# Patient Record
Sex: Female | Born: 1965
Health system: Southern US, Community
[De-identification: ages and names within clinical notes are randomized; demographics above are authoritative.]

## PROBLEM LIST (undated history)

## (undated) DIAGNOSIS — Z8619 Personal history of other infectious and parasitic diseases: Secondary | ICD-10-CM

## (undated) DIAGNOSIS — L309 Dermatitis, unspecified: Secondary | ICD-10-CM

## (undated) DIAGNOSIS — E079 Disorder of thyroid, unspecified: Secondary | ICD-10-CM

## (undated) DIAGNOSIS — IMO0002 Reserved for concepts with insufficient information to code with codable children: Secondary | ICD-10-CM

## (undated) DIAGNOSIS — L900 Lichen sclerosus et atrophicus: Secondary | ICD-10-CM

## (undated) DIAGNOSIS — K219 Gastro-esophageal reflux disease without esophagitis: Secondary | ICD-10-CM

## (undated) DIAGNOSIS — IMO0001 Reserved for inherently not codable concepts without codable children: Secondary | ICD-10-CM

## (undated) DIAGNOSIS — J3089 Other allergic rhinitis: Secondary | ICD-10-CM

## (undated) DIAGNOSIS — E7800 Pure hypercholesterolemia, unspecified: Secondary | ICD-10-CM

## (undated) DIAGNOSIS — C801 Malignant (primary) neoplasm, unspecified: Secondary | ICD-10-CM

## (undated) HISTORY — DX: Personal history of other infectious and parasitic diseases: Z86.19

## (undated) HISTORY — PX: PELVIC LAPAROSCOPY: SHX162

## (undated) HISTORY — DX: Disorder of thyroid, unspecified: E07.9

## (undated) HISTORY — DX: Malignant (primary) neoplasm, unspecified: C80.1

## (undated) HISTORY — PX: TONSILLECTOMY AND ADENOIDECTOMY: SUR1326

## (undated) HISTORY — DX: Other allergic rhinitis: J30.89

## (undated) HISTORY — DX: Reserved for inherently not codable concepts without codable children: IMO0001

## (undated) HISTORY — DX: Dermatitis, unspecified: L30.9

## (undated) HISTORY — DX: Gastro-esophageal reflux disease without esophagitis: K21.9

## (undated) HISTORY — DX: Lichen sclerosus et atrophicus: L90.0

## (undated) HISTORY — DX: Pure hypercholesterolemia, unspecified: E78.00

## (undated) HISTORY — PX: REFRACTIVE SURGERY: SHX103

## (undated) HISTORY — DX: Reserved for concepts with insufficient information to code with codable children: IMO0002

---

## 1999-08-16 ENCOUNTER — Inpatient Hospital Stay (HOSPITAL_COMMUNITY): Admission: AD | Admit: 1999-08-16 | Discharge: 1999-08-20 | Payer: Self-pay | Admitting: Gynecology

## 2000-10-09 ENCOUNTER — Other Ambulatory Visit: Admission: RE | Admit: 2000-10-09 | Discharge: 2000-10-09 | Payer: Self-pay | Admitting: Gynecology

## 2001-10-09 ENCOUNTER — Other Ambulatory Visit: Admission: RE | Admit: 2001-10-09 | Discharge: 2001-10-09 | Payer: Self-pay | Admitting: Gynecology

## 2002-02-05 ENCOUNTER — Other Ambulatory Visit: Admission: RE | Admit: 2002-02-05 | Discharge: 2002-02-05 | Payer: Self-pay | Admitting: Dermatology

## 2002-02-23 ENCOUNTER — Ambulatory Visit (HOSPITAL_COMMUNITY): Admission: RE | Admit: 2002-02-23 | Discharge: 2002-02-23 | Payer: Self-pay | Admitting: Obstetrics and Gynecology

## 2002-02-23 ENCOUNTER — Encounter: Payer: Self-pay | Admitting: Obstetrics and Gynecology

## 2003-04-14 ENCOUNTER — Other Ambulatory Visit: Admission: RE | Admit: 2003-04-14 | Discharge: 2003-04-14 | Payer: Self-pay | Admitting: Obstetrics and Gynecology

## 2004-04-14 ENCOUNTER — Other Ambulatory Visit: Admission: RE | Admit: 2004-04-14 | Discharge: 2004-04-14 | Payer: Self-pay | Admitting: Addiction Medicine

## 2005-01-30 ENCOUNTER — Ambulatory Visit (HOSPITAL_COMMUNITY): Admission: RE | Admit: 2005-01-30 | Discharge: 2005-01-30 | Payer: Self-pay | Admitting: Family Medicine

## 2005-04-17 ENCOUNTER — Other Ambulatory Visit: Admission: RE | Admit: 2005-04-17 | Discharge: 2005-04-17 | Payer: Self-pay | Admitting: Obstetrics and Gynecology

## 2006-05-02 ENCOUNTER — Other Ambulatory Visit: Admission: RE | Admit: 2006-05-02 | Discharge: 2006-05-02 | Payer: Self-pay | Admitting: Obstetrics and Gynecology

## 2007-05-14 ENCOUNTER — Other Ambulatory Visit: Admission: RE | Admit: 2007-05-14 | Discharge: 2007-05-14 | Payer: Self-pay | Admitting: Obstetrics and Gynecology

## 2008-05-25 ENCOUNTER — Encounter: Payer: Self-pay | Admitting: Obstetrics and Gynecology

## 2008-05-25 ENCOUNTER — Other Ambulatory Visit: Admission: RE | Admit: 2008-05-25 | Discharge: 2008-05-25 | Payer: Self-pay | Admitting: Obstetrics and Gynecology

## 2008-05-25 ENCOUNTER — Ambulatory Visit: Payer: Self-pay | Admitting: Obstetrics and Gynecology

## 2008-12-10 ENCOUNTER — Encounter: Admission: RE | Admit: 2008-12-10 | Discharge: 2008-12-10 | Payer: Self-pay | Admitting: Obstetrics and Gynecology

## 2009-05-26 ENCOUNTER — Other Ambulatory Visit: Admission: RE | Admit: 2009-05-26 | Discharge: 2009-05-26 | Payer: Self-pay | Admitting: Obstetrics and Gynecology

## 2009-05-26 ENCOUNTER — Ambulatory Visit: Payer: Self-pay | Admitting: Obstetrics and Gynecology

## 2009-06-01 ENCOUNTER — Ambulatory Visit: Payer: Self-pay | Admitting: Obstetrics and Gynecology

## 2009-12-30 ENCOUNTER — Encounter: Admission: RE | Admit: 2009-12-30 | Discharge: 2009-12-30 | Payer: Self-pay | Admitting: Obstetrics and Gynecology

## 2010-05-29 ENCOUNTER — Other Ambulatory Visit (HOSPITAL_COMMUNITY)
Admission: RE | Admit: 2010-05-29 | Discharge: 2010-05-29 | Disposition: A | Discharge status: Home / Self Care | Payer: BC Managed Care – PPO | Source: Ambulatory Visit | Attending: Obstetrics and Gynecology | Admitting: Obstetrics and Gynecology

## 2010-05-29 ENCOUNTER — Other Ambulatory Visit: Payer: Self-pay | Admitting: Obstetrics and Gynecology

## 2010-05-29 ENCOUNTER — Encounter (INDEPENDENT_AMBULATORY_CARE_PROVIDER_SITE_OTHER): Payer: BC Managed Care – PPO | Admitting: Obstetrics and Gynecology

## 2010-05-29 DIAGNOSIS — Z13220 Encounter for screening for lipoid disorders: Secondary | ICD-10-CM

## 2010-05-29 DIAGNOSIS — Z124 Encounter for screening for malignant neoplasm of cervix: Secondary | ICD-10-CM | POA: Insufficient documentation

## 2010-05-29 DIAGNOSIS — D4989 Neoplasm of unspecified behavior of other specified sites: Secondary | ICD-10-CM

## 2010-05-29 DIAGNOSIS — Z833 Family history of diabetes mellitus: Secondary | ICD-10-CM

## 2010-05-29 DIAGNOSIS — E039 Hypothyroidism, unspecified: Secondary | ICD-10-CM

## 2010-05-29 DIAGNOSIS — Z01419 Encounter for gynecological examination (general) (routine) without abnormal findings: Secondary | ICD-10-CM

## 2010-06-16 NOTE — H&P (Signed)
[facility]  Patient:    Carol Estrada, Carol Estrada                        MRN: [id number] Adm. Date:  [id number] Attending:  Merrily Pew                         History and Physical  CHIEF COMPLAINT:              Labor.  HISTORY OF PRESENT ILLNESS:   The patient is a 45 year old G1, P0 female at 79-[redacted] weeks gestation who enters with questionable rupture of membranes, having had a gush of fluid at approximately 3:00 a.m., although undocumented in maternity admissions without gross rupture of membranes, perineal fronting or Nitrocine.  The patient was placed on external monitors and found to be contracting every five minutes, with increasing intensity.  Her exam was 1 cm, 85%, -2 station.  The patient is admitted at this time for labor and delivery.  PAST MEDICAL HISTORY:         Uncomplicated.  PAST SURGICAL HISTORY:        Laparoscopy.  Tonsillectomy.  ALLERGIES:                    CEFTIN.  REVIEW OF SYSTEMS:            Noncontributory.  SOCIAL HISTORY:               No cigarette or alcohol use.  CURRENT MEDICATIONS:          Prenatal vitamins.  ADMISSION PHYSICAL EXAMINATION:  VITAL SIGNS:                  Afebrile with stable vital signs.  HEENT:                        Normal.  LUNGS:                        Clear.  CARDIAC:                      Regular rate without rubs, murmurs or gallops.  ABDOMEN:                      Gravid.  Vertex fetus.  Reactive fetal tracing. Contractions every five minutes.  PELVIC:                       The cervix is loose fingertip, 90%, -2 station. Rupture of forebag.  Clear.  ASSESSMENT:  A 45 year old G1, P0 at 37-38 weeks in early labor admitted for labor and delivery.  Beta Strep is negative.  The patient is Rh negative. DD:  08/16/99 TD:  08/16/99 Job: [id number]

## 2010-06-16 NOTE — Discharge Summary (Signed)
[facility] of [facility]  Patient:    Carol Estrada, Carol Estrada                        MRN: [id number] Adm. Date:  [id number] Disc. Date: [id number] Attending:  Merrily Pew Dictator:   Antony Contras, [facility]                           Discharge Summary  DISCHARGE DIAGNOSES:          1. Intrauterine pregnancy at 37-38 weeks.                               2. Cephalopelvic disproportion.  PROCEDURES:                   Low cervical transverse cesarean section.  HISTORY OF PRESENT ILLNESS:   The patient is a 45 year old, primigravida with an uncertain LMP and an EDC of September 01, 1999, by ultrasound.  Prenatal labs are as follows:  Blood type O-, antibody screen negative, RPR, HBSAG, and HIV, rubella immune, MSAFP normal, GBS negative.  HOSPITAL COURSE:              The patient was admitted on August 15, 1999, in early labor.  The patient progressed slowly from a loose fingertip, 90%, -2 station to a rim dilatation.  At this point, it was noticed that there was increased molding of the fetal head.  Secondary to the history of long labor and also prodromal labor from 8 cm to rim, it was felt prudent to proceed with cesarean section.  A low cervical transverse cesarean section was performed by Douglass Rivers, M.D., under epidural anesthesia.  The patient was delivered of an Apgar 9 and 102 female infant weighing 8 pounds 9 ounces.  Filmy pelvic adhesions were noted, otherwise normal tubes and ovaries.  During the postpartum course, the patient remained afebrile and had no difficulty voiding.  The infant was A+.  She did receive RhoGAM prior to discharge.  On postoperative CBC, the hematocrit was 28.6, hemoglobin 10.3, WBC 11.9, and platelets 179.  DISPOSITION:                  She was able to be discharged in satisfactory condition on her third postpartum day.  FOLLOW-UP:                    Follow up in the office in six weeks.  DISCHARGE MEDICATIONS:        Continue prenatal  vitamins and iron.  Motrin and Tylox for pain. DD:  09/08/99 TD:  09/09/99 Job: [id number]

## 2010-06-16 NOTE — Op Note (Signed)
[facility]  Patient:    Carol Estrada, Carol Estrada                        MRN: [id number] Proc. Date: 08/17/99 Adm. Date:  [id number] Attending:  Merrily Pew CC:         Douglass Rivers, M.D.                           Operative Report  PREOPERATIVE DIAGNOSIS:       Cephalopelvic disproportion.  POSTOPERATIVE DIAGNOSIS:      Cephalopelvic disproportion.  PROCEDURE:                    Primary cesarean section, low flap transverse                               via Pfannenstiel.  SURGEON:                      Douglass Rivers, M.D.  ASSISTANT:                    Scrub technician.  ANESTHESIA:                   Epidural.  ESTIMATED BLOOD LOSS:         800 CC.  FINDINGS:                     This was a viable female infant in the vertex presentation with clear amniotic fluid.  Apgars were 9 and 9.  Birth weight was 8 pounds 9 ounces.  Filmy pelvic adhesions were noted.  Normal tubes and ovaries.   COMPLICATIONS:                None.  PATHOLOGY:                    None.  DESCRIPTION OF PROCEDURE:     The patient was taken to the operating room and placed in the supine position.  The patient was prepped and draped in the usual  sterile fashion.  After adequate anesthesia was ensured, a Pfannenstiel skin incision was made with the scalpel and carried to the underlying layered fascia  with electrocautery.  The fascia was scored in the midline and the incision was  extended laterally with Mayo scissors.  The inferior aspect of the fascial incision was clamped with Kochers.  The underlying rectus muscles were dissected off by blunt and sharp dissection.  In a similar fashion the superior aspect of the fascial incision was grasped with Kochers, and the underlying rectus muscles were dissected off.  The rectus muscles were separated in the midline.  The peritoneum was identified and entered by blunt dissection.  The peritoneal incision was then extended  superiorly and inferiorly with good visualization of the underlying bowel and bladder.  The orientation of the uterus was identified.  The vesicouterine peritoneum was identified, tensed up, and entered sharply with the Metzenbaum. The incision extended laterally.  The bladder flap was created digitally.  The bladder blade was then reinserted in the lower uterine segment and incised in a transverse fashion with the scalpel.  Clear amniotic fluid was noted upon entering the cavity. The infant was delivered from the vertex presentation.  Bulb suction.  Cord was cut and  clamped and the infant handed off to the waiting pediatrician.  The cord bloods were obtained.  The uterus was massaged and the placenta was allowed to separate naturally.  The uterus was then cleared of all clots and debris.  The uterine incision was repaired with a running locking layer of #0 chromic.  There was a small extension on the right, which was also incorporated into the uterine repair. A small portion of the incision was imbricated due to bleeding at the edge. The uterus was returned back to the pelvis after irrigation.  The pelvis was irrigated with copious amounts of normal saline.  There was some bleeding noted at the apex, which was treated with a figure-of-eight.  Again reinspection assures hemostasis. The muscles, bladder, and peritoneum were inspected and noted to be hemostatic.  The fascia was closed with #0 Vicryl starting at the apex and going towards the  midline bilaterally.  The subcutaneous was irrigated.  The skin was closed with  staples.  The patient tolerated the procedure well.  The sponge, lap,  and needle counts ere correct x 2.  She was given Ancef intraoperatively and transferred to the post anesthesia care unit in stable condition. DD:  08/17/99 TD:  08/19/99 Job: [id number]

## 2010-11-29 ENCOUNTER — Other Ambulatory Visit: Payer: Self-pay | Admitting: Obstetrics and Gynecology

## 2010-11-29 DIAGNOSIS — Z1231 Encounter for screening mammogram for malignant neoplasm of breast: Secondary | ICD-10-CM

## 2011-01-05 ENCOUNTER — Ambulatory Visit
Admission: RE | Admit: 2011-01-05 | Discharge: 2011-01-05 | Disposition: A | Discharge status: Home / Self Care | Payer: BC Managed Care – PPO | Source: Ambulatory Visit | Attending: Obstetrics and Gynecology | Admitting: Obstetrics and Gynecology

## 2011-01-05 DIAGNOSIS — Z1231 Encounter for screening mammogram for malignant neoplasm of breast: Secondary | ICD-10-CM

## 2011-05-23 ENCOUNTER — Encounter: Payer: Self-pay | Admitting: Gynecology

## 2011-05-30 ENCOUNTER — Encounter: Payer: Self-pay | Admitting: Obstetrics and Gynecology

## 2011-05-30 ENCOUNTER — Other Ambulatory Visit (HOSPITAL_COMMUNITY)
Admission: RE | Admit: 2011-05-30 | Discharge: 2011-05-30 | Disposition: A | Discharge status: Home / Self Care | Payer: BC Managed Care – PPO | Source: Ambulatory Visit | Attending: Obstetrics and Gynecology | Admitting: Obstetrics and Gynecology

## 2011-05-30 ENCOUNTER — Ambulatory Visit (INDEPENDENT_AMBULATORY_CARE_PROVIDER_SITE_OTHER): Payer: BC Managed Care – PPO | Admitting: Obstetrics and Gynecology

## 2011-05-30 VITALS — BP 130/84 | Ht 64.0 in | Wt 154.0 lb

## 2011-05-30 DIAGNOSIS — Z01419 Encounter for gynecological examination (general) (routine) without abnormal findings: Secondary | ICD-10-CM | POA: Insufficient documentation

## 2011-05-30 DIAGNOSIS — E079 Disorder of thyroid, unspecified: Secondary | ICD-10-CM | POA: Insufficient documentation

## 2011-05-30 DIAGNOSIS — N904 Leukoplakia of vulva: Secondary | ICD-10-CM

## 2011-05-30 DIAGNOSIS — E7800 Pure hypercholesterolemia, unspecified: Secondary | ICD-10-CM

## 2011-05-30 DIAGNOSIS — E039 Hypothyroidism, unspecified: Secondary | ICD-10-CM | POA: Insufficient documentation

## 2011-05-30 LAB — TSH: TSH: 3.482 u[IU]/mL (ref 0.350–4.500)

## 2011-05-30 LAB — CBC WITH DIFFERENTIAL/PLATELET
HCT: 46.1 % — ABNORMAL HIGH (ref 36.0–46.0)
Hemoglobin: 15.6 g/dL — ABNORMAL HIGH (ref 12.0–15.0)
Lymphocytes Relative: 40 % (ref 12–46)
Lymphs Abs: 3.1 10*3/uL (ref 0.7–4.0)
Monocytes Absolute: 0.6 10*3/uL (ref 0.1–1.0)
Monocytes Relative: 7 % (ref 3–12)
Neutro Abs: 4 10*3/uL (ref 1.7–7.7)
Neutrophils Relative %: 52 % (ref 43–77)
RBC: 4.84 MIL/uL (ref 3.87–5.11)

## 2011-05-30 LAB — LIPID PANEL: LDL Cholesterol: 190 mg/dL — ABNORMAL HIGH (ref 0–99)

## 2011-05-30 MED ORDER — LEVOTHYROXINE SODIUM 25 MCG PO TABS: 25.0000 ug | ORAL_TABLET | Freq: Every day | ORAL | Status: DC

## 2011-05-30 MED ORDER — NORETHINDRONE-ETH ESTRADIOL 1-35 MG-MCG PO TABS: 1.0000 | ORAL_TABLET | Freq: Every day | ORAL | Status: DC

## 2011-05-30 MED ORDER — TRIAMTERENE-HCTZ 37.5-25 MG PO CAPS: 1.0000 | ORAL_CAPSULE | Freq: Every day | ORAL | Status: DC

## 2011-05-30 NOTE — Progress Notes (Signed)
Patient came to see me today for her annual GYN exam. She is having regular cycles on birth control pills. She takes them both for cycle control and contraception. She is having no pelvic pain. Several years ago she was complaining of hot flashes. Her TSH was slightly elevated. We have supported her on 25 MCG of  Synthroid. Since she's been on it she does not have hot flashes. She uses a fluid pill for fluid retention with excellent results. She is up-to-date on mammograms. She is having no abnormal bleeding. She had an area of lichen sclerosis near her anus. It has responded and disappeared with Temovate. She does not have dyspareunia.  Physical examination: Kennon Portela present. HEENT within normal limits. Neck: Thyroid not large. No masses. Supraclavicular nodes: not enlarged. Breasts: Examined in both sitting and lying  position. No skin changes and no masses. Abdomen: Soft no guarding rebound or masses or hernia. Pelvic: External: Within normal limits. BUS: Within normal limits. Vaginal:within normal limits. Good estrogen effect. No evidence of cystocele rectocele or enterocele. Cervix: clean. Uterus: Normal size and shape. Adnexa: No masses. Rectovaginal exam: Confirmatory and negative. Extremities: Within normal limits.  Assessment: #1. Hypothyroidism #2. Endometriosis #3. Lichen sclerosis  Plan: Continue Ortho-Novum 1/35. Continue Dyazide. Continue Synthroid. TSH checked.

## 2011-05-31 LAB — URINALYSIS W MICROSCOPIC + REFLEX CULTURE
Bacteria, UA: NONE SEEN
Bilirubin Urine: NEGATIVE
Casts: NONE SEEN
Crystals: NONE SEEN
Glucose, UA: NEGATIVE mg/dL
Ketones, ur: NEGATIVE mg/dL
Specific Gravity, Urine: <1.005 — ABNORMAL LOW (ref 1.005–1.030)
Squamous Epithelial / LPF: NONE SEEN
Urobilinogen, UA: 0.2 mg/dL (ref 0.0–1.0)
pH: 6.5 (ref 5.0–8.0)

## 2012-01-08 ENCOUNTER — Other Ambulatory Visit: Payer: Self-pay | Admitting: Gynecology

## 2012-01-08 ENCOUNTER — Other Ambulatory Visit: Payer: Self-pay | Admitting: Obstetrics and Gynecology

## 2012-01-08 DIAGNOSIS — Z1231 Encounter for screening mammogram for malignant neoplasm of breast: Secondary | ICD-10-CM

## 2012-01-17 ENCOUNTER — Ambulatory Visit (INDEPENDENT_AMBULATORY_CARE_PROVIDER_SITE_OTHER): Payer: BC Managed Care – PPO | Admitting: Gynecology

## 2012-01-17 ENCOUNTER — Encounter: Payer: Self-pay | Admitting: Gynecology

## 2012-01-17 VITALS — Temp 97.4°F

## 2012-01-17 DIAGNOSIS — M545 Low back pain, unspecified: Secondary | ICD-10-CM

## 2012-01-17 DIAGNOSIS — R6883 Chills (without fever): Secondary | ICD-10-CM

## 2012-01-17 LAB — URINALYSIS W MICROSCOPIC + REFLEX CULTURE
Glucose, UA: NEGATIVE mg/dL
Leukocytes, UA: NEGATIVE
Protein, ur: NEGATIVE mg/dL
Specific Gravity, Urine: <1.005 — ABNORMAL LOW (ref 1.005–1.030)
pH: 6.5 (ref 5.0–8.0)

## 2012-01-17 NOTE — Patient Instructions (Signed)
Take ibuprofen 600-800 mg at bedtime to help with her back pain. Monitor your other symptoms. If urinary symptoms develop or any other symptoms present then call. Otherwise we will follow at present.

## 2012-01-17 NOTE — Progress Notes (Signed)
Patient presents with several days noting some low back pain particularly at night. Having some chilly feelings, some mild aching and loss of appetite. No frank fevers, nausea, vomiting, diarrhea or constipation. No cough sputum URI symptoms.  Exam was kim assistant temperature 97.4 HEENT normal. Spine straight without paraspinous muscle spasm or tenderness Lungs clear Cardiac regular rate no rubs murmurs or gallops Abdomen soft nontender without masses guarding rebound organomegaly. Pelvic external normal bimanual without masses or tenderness  Urinalysis normal  Assessment and plan: Low back pain I suspect is musculoskeletal. Recommend ibuprofen 600-800 mg nightly. Chills, loss of appetite. Suspect low-grade viral. Will push fluids and monitor. Follow up if any new symptoms develop or the symptoms persist. Otherwise assuming they resolve then she'll follow up routinely.

## 2012-01-22 ENCOUNTER — Ambulatory Visit
Admission: RE | Admit: 2012-01-22 | Discharge: 2012-01-22 | Disposition: A | Discharge status: Home / Self Care | Payer: BC Managed Care – PPO | Source: Ambulatory Visit | Attending: Gynecology | Admitting: Gynecology

## 2012-01-22 DIAGNOSIS — Z1231 Encounter for screening mammogram for malignant neoplasm of breast: Secondary | ICD-10-CM

## 2012-04-25 ENCOUNTER — Other Ambulatory Visit: Payer: Self-pay | Admitting: *Deleted

## 2012-04-25 MED ORDER — PRAVASTATIN SODIUM 10 MG PO TABS: 10.0000 mg | ORAL_TABLET | Freq: Every day | ORAL | Status: DC

## 2012-05-13 ENCOUNTER — Other Ambulatory Visit: Payer: Self-pay | Admitting: Family Medicine

## 2012-05-13 NOTE — Telephone Encounter (Signed)
Chronic visit, 09/19/11

## 2012-06-05 ENCOUNTER — Ambulatory Visit (INDEPENDENT_AMBULATORY_CARE_PROVIDER_SITE_OTHER): Payer: BC Managed Care – PPO | Admitting: Gynecology

## 2012-06-05 ENCOUNTER — Encounter: Payer: Self-pay | Admitting: Obstetrics and Gynecology

## 2012-06-05 ENCOUNTER — Encounter: Payer: Self-pay | Admitting: Gynecology

## 2012-06-05 VITALS — BP 120/76 | Ht 63.0 in | Wt 158.0 lb

## 2012-06-05 DIAGNOSIS — B001 Herpesviral vesicular dermatitis: Secondary | ICD-10-CM

## 2012-06-05 DIAGNOSIS — B009 Herpesviral infection, unspecified: Secondary | ICD-10-CM

## 2012-06-05 DIAGNOSIS — E039 Hypothyroidism, unspecified: Secondary | ICD-10-CM

## 2012-06-05 DIAGNOSIS — Z01419 Encounter for gynecological examination (general) (routine) without abnormal findings: Secondary | ICD-10-CM

## 2012-06-05 DIAGNOSIS — L900 Lichen sclerosus et atrophicus: Secondary | ICD-10-CM

## 2012-06-05 DIAGNOSIS — Z309 Encounter for contraceptive management, unspecified: Secondary | ICD-10-CM

## 2012-06-05 DIAGNOSIS — L940 Localized scleroderma [morphea]: Secondary | ICD-10-CM

## 2012-06-05 LAB — CBC WITH DIFFERENTIAL/PLATELET
Eosinophils Absolute: 0 10*3/uL (ref 0.0–0.7)
Eosinophils Relative: 1 % (ref 0–5)
HCT: 43.3 % (ref 36.0–46.0)
Hemoglobin: 15.4 g/dL — ABNORMAL HIGH (ref 12.0–15.0)
Lymphs Abs: 2.5 10*3/uL (ref 0.7–4.0)
MCH: 32.8 pg (ref 26.0–34.0)
MCV: 92.3 fL (ref 78.0–100.0)
Monocytes Absolute: 0.8 10*3/uL (ref 0.1–1.0)
Monocytes Relative: 12 % (ref 3–12)
RBC: 4.69 MIL/uL (ref 3.87–5.11)

## 2012-06-05 LAB — URINALYSIS W MICROSCOPIC + REFLEX CULTURE
Glucose, UA: NEGATIVE mg/dL
Leukocytes, UA: NEGATIVE
Protein, ur: NEGATIVE mg/dL
Squamous Epithelial / LPF: NONE SEEN
Urobilinogen, UA: 0.2 mg/dL (ref 0.0–1.0)

## 2012-06-05 LAB — COMPREHENSIVE METABOLIC PANEL
CO2: 22 mEq/L (ref 19–32)
Glucose, Bld: 108 mg/dL — ABNORMAL HIGH (ref 70–99)
Sodium: 135 mEq/L (ref 135–145)
Total Bilirubin: 0.3 mg/dL (ref 0.3–1.2)
Total Protein: 6.6 g/dL (ref 6.0–8.3)

## 2012-06-05 MED ORDER — TRIAMTERENE-HCTZ 37.5-25 MG PO CAPS: 1.0000 | ORAL_CAPSULE | ORAL | Status: DC

## 2012-06-05 MED ORDER — NORETHINDRONE-ETH ESTRADIOL 1-35 MG-MCG PO TABS: 1.0000 | ORAL_TABLET | Freq: Every day | ORAL | Status: DC

## 2012-06-05 MED ORDER — LEVOTHYROXINE SODIUM 25 MCG PO TABS: 25.0000 ug | ORAL_TABLET | Freq: Every day | ORAL | Status: DC

## 2012-06-05 NOTE — Progress Notes (Signed)
Carol Estrada Jun 28, 1965 [id number]        47 y.o.  G1P1001 for annual exam.  Doing well. Former patient of Dr. Eda Paschal. Several issues noted below.  Past medical history,surgical history, medications, allergies, family history and social history were all reviewed and documented in the EPIC chart. ROS:  Was performed and pertinent positives and negatives are included in the history.  Exam: Kim assistant Filed Vitals:   06/05/12 0941  BP: 120/76  Height: 5\' 3"  (1.6 m)  Weight: 158 lb (71.668 kg)   General appearance  Normal Skin grossly normal Head/Neck normal with no cervical or supraclavicular adenopathy thyroid normal Lungs  clear Cardiac RR, without RMG Abdominal  soft, nontender, without masses, organomegaly or hernia Breasts  examined lying and sitting without masses, retractions, discharge or axillary adenopathy. Pelvic  Ext/BUS/vagina  normal   Cervix  normal   Uterus  anteverted, normal size, shape and contour, midline and mobile nontender   Adnexa  Without masses or tenderness    Anus and perineum  normal   Rectovaginal  normal sphincter tone without palpated masses or tenderness.    Assessment/Plan:  47 y.o. G1P1001 female for annual exam, regular menses, Ortho-Novum 1/35 contraception.   1. Contraception. Patient on Ortho-Novum 1/35 doing well wants to continue. I reviewed the risks to include stroke heart attack DVT. Increasing age with possible increased risk. Not a polyp or a medical problems like hypertension diabetes. Except the risks and wants to continue it I refilled her times a year. 2. Lichen sclerosus. Perianal. Diagnosed with biopsy by Dr. Eda Paschal per her history. Uses Temovate cream intermittently. Exam today is normal. She has cream at home and will use as needed. 3. Herpes labialis. Historically she will get a crease of the nose outbreak for which she uses acyclovir twice a day times several days which aborts the outbreak. She got her last  prescription through her primary physician. 4. Occasional swelling. Patient has occasional swelling for which she uses Dyazide per Dr. Eda Paschal intermittently. I reviewed the issues of swelling and etiologies to include hormonal versus cardiac or renal. If she is using this occasionally I do not have a problem with this but if she needs to use this on a frequent basis she needs to be evaluated by her primary physician and followed/treated by him. Dyazide #30 with 2 refills provided. TSH today. electrolytes today and renal function with comprehensive metabolic panel. 5. Hypothyroid. Was diagnosed by Dr. Eda Paschal with elevated TSH and is on Synthroid 25 mcg. I refilled her x1 year and we will check a TSH today. 6. Hypercholesterolemia. Patient's been followed by her primary historically and will continue to do so. 7. Mammography 12 2013. Continue with annual mammography. SBE monthly reviewed. 8. Pap smear 2013. No Pap smear done today. No history of abnormal Pap smears previously. Plan repeat at 3 year interval. 9. Health maintenance. Continue to followup with her primary for routine health maintenance. Baseline CBCcomprehensive metabolic panel TSH urinalysis done. We'll have lipids checked through her primary. Followup one year, sooner as needed.    Dara Lords MD, 10:46 AM 06/05/2012

## 2012-06-05 NOTE — Patient Instructions (Signed)
Followup for annual exam in one year, sooner as needed. 

## 2012-07-03 ENCOUNTER — Ambulatory Visit (INDEPENDENT_AMBULATORY_CARE_PROVIDER_SITE_OTHER): Payer: BC Managed Care – PPO | Admitting: Family Medicine

## 2012-07-03 ENCOUNTER — Encounter: Payer: Self-pay | Admitting: Family Medicine

## 2012-07-03 VITALS — BP 110/78 | Temp 98.3°F | Wt 157.8 lb

## 2012-07-03 DIAGNOSIS — E785 Hyperlipidemia, unspecified: Secondary | ICD-10-CM

## 2012-07-03 DIAGNOSIS — J329 Chronic sinusitis, unspecified: Secondary | ICD-10-CM

## 2012-07-03 MED ORDER — AZITHROMYCIN 250 MG PO TABS: ORAL_TABLET | ORAL | Status: DC

## 2012-07-03 NOTE — Progress Notes (Signed)
  Subjective:    Patient ID: Carol Estrada, female    DOB: [birth year], 47 y.o.   MRN: [id number]  Sinus Problem This is a new problem. The current episode started in the past 7 days. The problem has been gradually worsening since onset. Her pain is at a severity of 5/10. The pain is moderate. Associated symptoms include a hoarse voice and sinus pressure. Past treatments include nothing. The treatment provided moderate relief.   No vom no diarrhea Slept in the couch. Lot ofpost nasal cong. h a off and on Patient on lipid medications. Has not had blood work from our office for half a year. Reports having blood work from her GYN. Review of Systems  HENT: Positive for hoarse voice and sinus pressure.    ROS otherwise negative     Objective:   Physical Exam  Alert no acute distress. HEENT moderate nasal congestion frontal tenderness. Pharynx erythematous Lungs clear. Heart regular rate and rhythm.     Assessment & Plan:  Impression #1 sinusitis. #2 hyperlipidemia discussed. Plan need to get the blood work results to [place]. Antibiotics prescribed. Symptomatic care discussed. Check every 6 months. Patient to followup with chronic visit plus blood work soon. WSL

## 2012-07-06 DIAGNOSIS — E785 Hyperlipidemia, unspecified: Secondary | ICD-10-CM | POA: Insufficient documentation

## 2012-07-07 ENCOUNTER — Encounter: Payer: Self-pay | Admitting: *Deleted

## 2012-08-12 ENCOUNTER — Encounter: Payer: Self-pay | Admitting: Family Medicine

## 2012-08-12 ENCOUNTER — Ambulatory Visit (INDEPENDENT_AMBULATORY_CARE_PROVIDER_SITE_OTHER): Payer: BC Managed Care – PPO | Admitting: Family Medicine

## 2012-08-12 VITALS — BP 122/78 | Wt 157.4 lb

## 2012-08-12 DIAGNOSIS — E785 Hyperlipidemia, unspecified: Secondary | ICD-10-CM

## 2012-08-12 MED ORDER — PRAVASTATIN SODIUM 10 MG PO TABS: 10.0000 mg | ORAL_TABLET | Freq: Every day | ORAL | Status: DC

## 2012-08-12 NOTE — Progress Notes (Signed)
Subjective:    Patient ID: Carol Estrada, female    DOB: September 12, 1965, 47 y.o.   MRN: [id number]  Hyperlipidemia This is a chronic problem. The problem is uncontrolled. Recent lipid tests were reviewed and are variable. There are no known factors aggravating her hyperlipidemia. Associated symptoms include chest pain. Current antihyperlipidemic treatment includes statins. The current treatment provides mild (question fidgitnees with statin intake) improvement of lipids. There are no compliance problems.    Results for orders placed in visit on 06/05/12  CBC WITH DIFFERENTIAL      Result Value Range   WBC 6.3  4.0 - 10.5 K/uL   RBC 4.69  3.87 - 5.11 MIL/uL   Hemoglobin 15.4 (*) 12.0 - 15.0 g/dL   HCT 01.6  01.0 - 93.2 %   MCV 92.3  78.0 - 100.0 fL   MCH 32.8  26.0 - 34.0 pg   MCHC 35.6  30.0 - 36.0 g/dL   RDW 35.5  73.2 - 20.2 %   Platelets 303  150 - 400 K/uL   Neutrophils Relative % 47  43 - 77 %   Neutro Abs 2.9  1.7 - 7.7 K/uL   Lymphocytes Relative 39  12 - 46 %   Lymphs Abs 2.5  0.7 - 4.0 K/uL   Monocytes Relative 12  3 - 12 %   Monocytes Absolute 0.8  0.1 - 1.0 K/uL   Eosinophils Relative 1  0 - 5 %   Eosinophils Absolute 0.0  0.0 - 0.7 K/uL   Basophils Relative 1  0 - 1 %   Basophils Absolute 0.1  0.0 - 0.1 K/uL   Smear Review Criteria for review not met    COMPREHENSIVE METABOLIC PANEL      Result Value Range   Sodium 135  135 - 145 mEq/L   Potassium 3.8  3.5 - 5.3 mEq/L   Chloride 104  96 - 112 mEq/L   CO2 22  19 - 32 mEq/L   Glucose, Bld 108 (*) 70 - 99 mg/dL   BUN 9  6 - 23 mg/dL   Creat 5.42  7.06 - 2.37 mg/dL   Total Bilirubin 0.3  0.3 - 1.2 mg/dL   Alkaline Phosphatase 51  39 - 117 U/L   AST 16  0 - 37 U/L   ALT 15  0 - 35 U/L   Total Protein 6.6  6.0 - 8.3 g/dL   Albumin 3.7  3.5 - 5.2 g/dL   Calcium 9.2  8.4 - 62.8 mg/dL  URINALYSIS W MICROSCOPIC + REFLEX CULTURE      Result Value Range   Color, Urine YELLOW  YELLOW   APPearance CLEAR  CLEAR   Specific  Gravity, Urine 1.011  1.005 - 1.030   pH 7.0  5.0 - 8.0   Glucose, UA NEG  NEG mg/dL   Bilirubin Urine NEG  NEG   Ketones, ur NEG  NEG mg/dL   Hgb urine dipstick NEG  NEG   Protein, ur NEG  NEG mg/dL   Urobilinogen, UA 0.2  0.0 - 1.0 mg/dL   Nitrite NEG  NEG   Leukocytes, UA NEG  NEG   Squamous Epithelial / LPF NONE SEEN  RARE   Crystals NONE SEEN  NONE SEEN   Casts NONE SEEN  NONE SEEN   WBC, UA 0-2  <3 WBC/hpf   RBC / HPF 0-2  <3 RBC/hpf   Bacteria, UA NONE SEEN  RARE  TSH  Result Value Range   TSH 3.213  0.350 - 4.500 uIU/mL      Review of Systems  Cardiovascular: Positive for chest pain.   pain sharp transient. No weight loss or weight gain.     Objective:   Physical Exam Alert no acute distress. HEENT normal. Lungs clear. Heart regular rate and rhythm.       Assessment & Plan:  Impression hyperlipidemia control uncertain. Of note LDL was extremely high before initiating therapy approaching 190. Taking only 3 at 07 days on the nighttime medicine. Plan move Pravachol to every morning. Rationale discussed. Diet exercise discussed. Check every 6 months. WSL

## 2012-08-18 ENCOUNTER — Other Ambulatory Visit: Payer: Self-pay | Admitting: *Deleted

## 2012-08-18 MED ORDER — PRAVASTATIN SODIUM 20 MG PO TABS: 20.0000 mg | ORAL_TABLET | Freq: Every day | ORAL | Status: DC

## 2012-12-19 ENCOUNTER — Telehealth: Payer: Self-pay | Admitting: Family Medicine

## 2012-12-19 NOTE — Telephone Encounter (Signed)
Does patient need updated blood work

## 2012-12-19 NOTE — Telephone Encounter (Signed)
In two mo, call back then for b w and ov

## 2012-12-19 NOTE — Telephone Encounter (Signed)
Notified patient in two mo, call back then for b w and ov. Patient verbalized understanding.

## 2012-12-23 ENCOUNTER — Other Ambulatory Visit: Payer: Self-pay | Admitting: Family Medicine

## 2013-02-03 ENCOUNTER — Telehealth: Payer: Self-pay | Admitting: Family Medicine

## 2013-02-03 NOTE — Telephone Encounter (Signed)
Does patient need BW ?

## 2013-02-03 NOTE — Telephone Encounter (Signed)
Patient had cbc with diff, met 7, and TSH-   5/14

## 2013-02-05 NOTE — Telephone Encounter (Signed)
Notified patient via VM.

## 2013-02-05 NOTE — Telephone Encounter (Signed)
Can wait til may

## 2013-02-11 ENCOUNTER — Ambulatory Visit (INDEPENDENT_AMBULATORY_CARE_PROVIDER_SITE_OTHER): Payer: BC Managed Care – PPO | Admitting: Family Medicine

## 2013-02-11 ENCOUNTER — Encounter: Payer: Self-pay | Admitting: Family Medicine

## 2013-02-11 VITALS — BP 122/78 | Ht 64.0 in | Wt 160.6 lb

## 2013-02-11 DIAGNOSIS — R21 Rash and other nonspecific skin eruption: Secondary | ICD-10-CM

## 2013-02-11 DIAGNOSIS — E039 Hypothyroidism, unspecified: Secondary | ICD-10-CM

## 2013-02-11 DIAGNOSIS — E782 Mixed hyperlipidemia: Secondary | ICD-10-CM

## 2013-02-11 DIAGNOSIS — E079 Disorder of thyroid, unspecified: Secondary | ICD-10-CM

## 2013-02-11 DIAGNOSIS — Z79899 Other long term (current) drug therapy: Secondary | ICD-10-CM

## 2013-02-11 DIAGNOSIS — E785 Hyperlipidemia, unspecified: Secondary | ICD-10-CM

## 2013-02-11 MED ORDER — HYDROCORTISONE 2.5 % EX CREA: TOPICAL_CREAM | Freq: Two times a day (BID) | CUTANEOUS | Status: DC

## 2013-02-11 NOTE — Progress Notes (Signed)
   Subjective:    Patient ID: Carol Estrada, female    DOB: 1965-07-02, 48 y.o.   MRN: [id number]  HPI Patient arrives for a follow up on cholesterol. Along with other concerns. Now compliant with cholesterol medication. No obvious side effects. Watching her diet closely.  Patient claims compliance with thyroid medicine. Of note this was initiated by patient's obstetrician. 22 borderline TSH along with family history of low thyroid along with personal fatigue. Patient states OB/GYN stated wanted to "get ahead of it"   atient having problems with dry patches of skin-eyes dry wonders if it is related to meds or eczema. Has a history of eczema. Elsewhere in the body.  Compliant with meds. So so on the exercise,    Review of Systems No chest pain no back pain no abdominal pain no change in bowel habits ROS otherwise negative    Objective:   Physical Exam  Alert no apparent distress. HEENT scaly rash upper eyelids left greater than right slight edema lungs clear. Heart rare in rhythm. Blood pressure excellent on repeat. Thyroid nonpalpable.      Assessment & Plan:  Impression 1 flare of chronic eczema #2 hyperlipidemia status uncertain. #3 hypothyroidism patient states he OB/GYN has handed over the management to [place]. No symptoms of high or low thyroid. Compliant with medications. Plan check appropriate blood work. Exercise strongly encourage. Compliance discussed. Further recommendations based results. Hydrocortisone twice a day to affected area. Check every 6 months. WSL

## 2013-02-13 LAB — BASIC METABOLIC PANEL
BUN: 10 mg/dL (ref 6–23)
CHLORIDE: 106 meq/L (ref 96–112)
CO2: 26 meq/L (ref 19–32)
CREATININE: 0.68 mg/dL (ref 0.50–1.10)
Calcium: 8.8 mg/dL (ref 8.4–10.5)
GLUCOSE: 75 mg/dL (ref 70–99)
POTASSIUM: 4.2 meq/L (ref 3.5–5.3)
Sodium: 138 mEq/L (ref 135–145)

## 2013-02-13 LAB — HEPATIC FUNCTION PANEL
ALK PHOS: 49 U/L (ref 39–117)
ALT: 11 U/L (ref 0–35)
AST: 15 U/L (ref 0–37)
Albumin: 3.6 g/dL (ref 3.5–5.2)
BILIRUBIN DIRECT: 0.1 mg/dL (ref 0.0–0.3)
BILIRUBIN INDIRECT: 0.3 mg/dL (ref 0.0–0.9)
BILIRUBIN TOTAL: 0.4 mg/dL (ref 0.3–1.2)
Total Protein: 6.2 g/dL (ref 6.0–8.3)

## 2013-02-13 LAB — LIPID PANEL
Cholesterol: 175 mg/dL (ref 0–200)
HDL: 49 mg/dL (ref >39)
LDL CALC: 106 mg/dL — AB (ref 0–99)
Total CHOL/HDL Ratio: 3.6 Ratio
Triglycerides: 98 mg/dL (ref <150)
VLDL: 20 mg/dL (ref 0–40)

## 2013-02-13 LAB — TSH: TSH: 5.152 u[IU]/mL — ABNORMAL HIGH (ref 0.350–4.500)

## 2013-02-18 MED ORDER — LEVOTHYROXINE SODIUM 50 MCG PO TABS: 50.0000 ug | ORAL_TABLET | Freq: Every day | ORAL | Status: DC

## 2013-02-18 NOTE — Addendum Note (Signed)
Addended by: Dairl Ponder on: 02/18/2013 08:39 AM   Modules accepted: Orders

## 2013-04-27 ENCOUNTER — Other Ambulatory Visit: Payer: Self-pay | Admitting: Family Medicine

## 2013-05-06 ENCOUNTER — Other Ambulatory Visit: Payer: Self-pay

## 2013-05-06 DIAGNOSIS — Z1231 Encounter for screening mammogram for malignant neoplasm of breast: Secondary | ICD-10-CM

## 2013-05-21 ENCOUNTER — Ambulatory Visit
Admission: RE | Admit: 2013-05-21 | Discharge: 2013-05-21 | Disposition: A | Discharge status: Home / Self Care | Payer: BC Managed Care – PPO | Source: Ambulatory Visit

## 2013-05-21 ENCOUNTER — Encounter (INDEPENDENT_AMBULATORY_CARE_PROVIDER_SITE_OTHER): Payer: Self-pay

## 2013-05-21 DIAGNOSIS — Z1231 Encounter for screening mammogram for malignant neoplasm of breast: Secondary | ICD-10-CM

## 2013-05-27 ENCOUNTER — Other Ambulatory Visit: Payer: Self-pay | Admitting: Family Medicine

## 2013-06-26 ENCOUNTER — Other Ambulatory Visit: Payer: Self-pay | Admitting: Family Medicine

## 2013-07-03 ENCOUNTER — Other Ambulatory Visit: Payer: Self-pay

## 2013-07-03 NOTE — Telephone Encounter (Signed)
CE is scheduled for 07/06/13 with you.

## 2013-07-05 MED ORDER — TRIAMTERENE-HCTZ 37.5-25 MG PO CAPS: 1.0000 | ORAL_CAPSULE | ORAL | Status: DC

## 2013-07-06 ENCOUNTER — Ambulatory Visit (INDEPENDENT_AMBULATORY_CARE_PROVIDER_SITE_OTHER): Payer: BC Managed Care – PPO | Admitting: Gynecology

## 2013-07-06 ENCOUNTER — Encounter: Payer: Self-pay | Admitting: Gynecology

## 2013-07-06 VITALS — BP 120/80 | Ht 64.0 in | Wt 162.0 lb

## 2013-07-06 DIAGNOSIS — Z01419 Encounter for gynecological examination (general) (routine) without abnormal findings: Secondary | ICD-10-CM

## 2013-07-06 LAB — URINALYSIS W MICROSCOPIC + REFLEX CULTURE
BILIRUBIN URINE: NEGATIVE
GLUCOSE, UA: NEGATIVE mg/dL
HGB URINE DIPSTICK: NEGATIVE
KETONES UR: NEGATIVE mg/dL
Leukocytes, UA: NEGATIVE
Nitrite: NEGATIVE
PROTEIN: NEGATIVE mg/dL
Specific Gravity, Urine: <1.005 — ABNORMAL LOW (ref 1.005–1.030)
Urobilinogen, UA: 0.2 mg/dL (ref 0.0–1.0)
pH: 6 (ref 5.0–8.0)

## 2013-07-06 MED ORDER — NORETHINDRONE-ETH ESTRADIOL 1-35 MG-MCG PO TABS: 1.0000 | ORAL_TABLET | Freq: Every day | ORAL | Status: DC

## 2013-07-06 MED ORDER — ACYCLOVIR 400 MG PO TABS: 400.0000 mg | ORAL_TABLET | Freq: Two times a day (BID) | ORAL | Status: DC

## 2013-07-06 MED ORDER — TRIAMTERENE-HCTZ 37.5-25 MG PO CAPS: 1.0000 | ORAL_CAPSULE | ORAL | Status: DC

## 2013-07-06 NOTE — Progress Notes (Signed)
Carol Estrada May 01, 1965 [id number]        48 y.o.  G1P1001 for annual exam.  Several issues noted below.  Past medical history,surgical history, problem list, medications, allergies, family history and social history were all reviewed and documented as reviewed in the EPIC chart.  ROS:  12 system ROS performed with pertinent positives and negatives included in the history, assessment and plan.  Included Systems: General, HEENT, Neck, Cardiovascular, Pulmonary, Gastrointestinal, Genitourinary, Musculoskeletal, Dermatologic, Endocrine, Hematological, Neurologic, Psychiatric Additional significant findings :  None   Exam: Kim assistant Filed Vitals:   07/06/13 1456  BP: 120/80  Height: 5\' 4"  (1.626 m)  Weight: 162 lb (73.483 kg)   General appearance:  Normal affect, orientation and appearance. Skin: Grossly normal HEENT: Without gross lesions.  No cervical or supraclavicular adenopathy. Thyroid normal.  Lungs:  Clear without wheezing, rales or rhonchi Cardiac: RR, without RMG Abdominal:  Soft, nontender, without masses, guarding, rebound, organomegaly or hernia Breasts:  Examined lying and sitting without masses, retractions, discharge or axillary adenopathy. Pelvic:  Ext/BUS/vagina normal  Cervix normal  Uterus retroverted, normal size, shape and contour, midline and mobile nontender   Adnexa  Without masses or tenderness    Anus and perineum  Normal   Rectovaginal  Normal sphincter tone without palpated masses or tenderness.    Assessment/Plan:  48 y.o. G68P1001 female for annual exam with regular menses, oral contraceptives.   1. Contraceptive management. Patient on Ortho-Novum 1/35 equivalents for menstrual regulation/contraception. Doing well wants to continue. I reviewed risks to include increased risk of stroke heart attack DVT. Never smoked. Is being followed for elevated cholesterol with recent use normal on pravastatin. Options to switch to a lower dose pill for  theoretical benefits of decreased thrombosis risk. Patient's comfortable continuing at present dose excepting the risks and I refilled her x1 year. 2. Hypothyroidism. Taking Synthroid. Being managed by her primary for this. Recent dosage adjustment. 3. Herpes labialis. Gets occasional outbreaks at the corner of her nose. Takes 2 400 mg acyclovir and this seems to eradicating. #30 no refills provided. 4. Lichen sclerosis. History of whitish area biopsied by Dr. Cherylann Banas. Treated transiently with Temovate. Symptoms have resolved and she's never had a recurrence. Exam is normal today. Patient will monitor and represent if she has any symptoms. 5. Occasional swelling. Patient taking Dyazide one or 2 pills weekly, says it makes her feel better whenever she has some swelling. Had electrolytes checked earlier this year by her primary which were normal. Dyazide #30 with 3 refills provided. Told her that I did not want to see her taking it daily but occasionally use is okay. 6. Mammography 04/2013. Continue with annual mammography. SBE monthly reviewed. 7. Pap smear 2013. No Pap smear done today. No history of abnormal Pap smears previously. Plan repeat Pap smear next year at three-year interval. 8. Health maintenance. No routine blood work done as this is done through her primary physician's office. Followup one year, sooner as needed.   Note: This document was prepared with digital dictation and possible smart phrase technology. Any transcriptional errors that result from this process are unintentional.   Anastasio Auerbach MD, 3:22 PM 07/06/2013

## 2013-07-06 NOTE — Patient Instructions (Signed)
Followup in one year, sooner as needed.  You may obtain a copy of any labs that were done today by logging onto MyChart as outlined in the instructions provided with your AVS (after visit summary). The office will not call with normal lab results but certainly if there are any significant abnormalities then we will contact you.   Health Maintenance, Female A healthy lifestyle and preventative care can promote health and wellness.  Maintain regular health, dental, and eye exams.  Eat a healthy diet. Foods like vegetables, fruits, whole grains, low-fat dairy products, and lean protein foods contain the nutrients you need without too many calories. Decrease your intake of foods high in solid fats, added sugars, and salt. Get information about a proper diet from your caregiver, if necessary.  Regular physical exercise is one of the most important things you can do for your health. Most adults should get at least 150 minutes of moderate-intensity exercise (any activity that increases your heart rate and causes you to sweat) each week. In addition, most adults need muscle-strengthening exercises on 2 or more days a week.   Maintain a healthy weight. The body mass index (BMI) is a screening tool to identify possible weight problems. It provides an estimate of body fat based on height and weight. Your caregiver can help determine your BMI, and can help you achieve or maintain a healthy weight. For adults 20 years and older:  A BMI below 18.5 is considered underweight.  A BMI of 18.5 to 24.9 is normal.  A BMI of 25 to 29.9 is considered overweight.  A BMI of 30 and above is considered obese.  Maintain normal blood lipids and cholesterol by exercising and minimizing your intake of saturated fat. Eat a balanced diet with plenty of fruits and vegetables. Blood tests for lipids and cholesterol should begin at age 41 and be repeated every 5 years. If your lipid or cholesterol levels are high, you are over  50, or you are a high risk for heart disease, you may need your cholesterol levels checked more frequently.Ongoing high lipid and cholesterol levels should be treated with medicines if diet and exercise are not effective.  If you smoke, find out from your caregiver how to quit. If you do not use tobacco, do not start.  Lung cancer screening is recommended for adults aged 49 80 years who are at high risk for developing lung cancer because of a history of smoking. Yearly low-dose computed tomography (CT) is recommended for people who have at least a 30-pack-year history of smoking and are a current smoker or have quit within the past 15 years. A pack year of smoking is smoking an average of 1 pack of cigarettes a day for 1 year (for example: 1 pack a day for 30 years or 2 packs a day for 15 years). Yearly screening should continue until the smoker has stopped smoking for at least 15 years. Yearly screening should also be stopped for people who develop a health problem that would prevent them from having lung cancer treatment.  If you are pregnant, do not drink alcohol. If you are breastfeeding, be very cautious about drinking alcohol. If you are not pregnant and choose to drink alcohol, do not exceed 1 drink per day. One drink is considered to be 12 ounces (355 mL) of beer, 5 ounces (148 mL) of wine, or 1.5 ounces (44 mL) of liquor.  Avoid use of street drugs. Do not share needles with anyone. Ask for help  if you need support or instructions about stopping the use of drugs.  High blood pressure causes heart disease and increases the risk of stroke. Blood pressure should be checked at least every 1 to 2 years. Ongoing high blood pressure should be treated with medicines, if weight loss and exercise are not effective.  If you are 55 to 46 years old, ask your caregiver if you should take aspirin to prevent strokes.  Diabetes screening involves taking a blood sample to check your fasting blood sugar level.  This should be done once every 3 years, after age 45, if you are within normal weight and without risk factors for diabetes. Testing should be considered at a younger age or be carried out more frequently if you are overweight and have at least 1 risk factor for diabetes.  Breast cancer screening is essential preventative care for women. You should practice "breast self-awareness." This means understanding the normal appearance and feel of your breasts and may include breast self-examination. Any changes detected, no matter how small, should be reported to a caregiver. Women in their 20s and 30s should have a clinical breast exam (CBE) by a caregiver as part of a regular health exam every 1 to 3 years. After age 40, women should have a CBE every year. Starting at age 40, women should consider having a mammogram (breast X-ray) every year. Women who have a family history of breast cancer should talk to their caregiver about genetic screening. Women at a high risk of breast cancer should talk to their caregiver about having an MRI and a mammogram every year.  Breast cancer gene (BRCA)-related cancer risk assessment is recommended for women who have family members with BRCA-related cancers. BRCA-related cancers include breast, ovarian, tubal, and peritoneal cancers. Having family members with these cancers may be associated with an increased risk for harmful changes (mutations) in the breast cancer genes BRCA1 and BRCA2. Results of the assessment will determine the need for genetic counseling and BRCA1 and BRCA2 testing.  The Pap test is a screening test for cervical cancer. Women should have a Pap test starting at age 21. Between ages 21 and 29, Pap tests should be repeated every 2 years. Beginning at age 30, you should have a Pap test every 3 years as long as the past 3 Pap tests have been normal. If you had a hysterectomy for a problem that was not cancer or a condition that could lead to cancer, then you no  longer need Pap tests. If you are between ages 65 and 70, and you have had normal Pap tests going back 10 years, you no longer need Pap tests. If you have had past treatment for cervical cancer or a condition that could lead to cancer, you need Pap tests and screening for cancer for at least 20 years after your treatment. If Pap tests have been discontinued, risk factors (such as a new sexual partner) need to be reassessed to determine if screening should be resumed. Some women have medical problems that increase the chance of getting cervical cancer. In these cases, your caregiver may recommend more frequent screening and Pap tests.  The human papillomavirus (HPV) test is an additional test that may be used for cervical cancer screening. The HPV test looks for the virus that can cause the cell changes on the cervix. The cells collected during the Pap test can be tested for HPV. The HPV test could be used to screen women aged 30 years and older, and   should be used in women of any age who have unclear Pap test results. After the age of 30, women should have HPV testing at the same frequency as a Pap test.  Colorectal cancer can be detected and often prevented. Most routine colorectal cancer screening begins at the age of 50 and continues through age 75. However, your caregiver may recommend screening at an earlier age if you have risk factors for colon cancer. On a yearly basis, your caregiver may provide home test kits to check for hidden blood in the stool. Use of a small camera at the end of a tube, to directly examine the colon (sigmoidoscopy or colonoscopy), can detect the earliest forms of colorectal cancer. Talk to your caregiver about this at age 50, when routine screening begins. Direct examination of the colon should be repeated every 5 to 10 years through age 75, unless early forms of pre-cancerous polyps or small growths are found.  Hepatitis C blood testing is recommended for all people born from  1945 through 1965 and any individual with known risks for hepatitis C.  Practice safe sex. Use condoms and avoid high-risk sexual practices to reduce the spread of sexually transmitted infections (STIs). Sexually active women aged 25 and younger should be checked for Chlamydia, which is a common sexually transmitted infection. Older women with new or multiple partners should also be tested for Chlamydia. Testing for other STIs is recommended if you are sexually active and at increased risk.  Osteoporosis is a disease in which the bones lose minerals and strength with aging. This can result in serious bone fractures. The risk of osteoporosis can be identified using a bone density scan. Women ages 65 and over and women at risk for fractures or osteoporosis should discuss screening with their caregivers. Ask your caregiver whether you should be taking a calcium supplement or vitamin D to reduce the rate of osteoporosis.  Menopause can be associated with physical symptoms and risks. Hormone replacement therapy is available to decrease symptoms and risks. You should talk to your caregiver about whether hormone replacement therapy is right for you.  Use sunscreen. Apply sunscreen liberally and repeatedly throughout the day. You should seek shade when your shadow is shorter than you. Protect yourself by wearing long sleeves, pants, a wide-brimmed hat, and sunglasses year round, whenever you are outdoors.  Notify your caregiver of new moles or changes in moles, especially if there is a change in shape or color. Also notify your caregiver if a mole is larger than the size of a pencil eraser.  Stay current with your immunizations. Document Released: 07/31/2010 Document Revised: 05/12/2012 Document Reviewed: 07/31/2010 [employer] Patient Information 2014 [employer].   

## 2013-07-14 ENCOUNTER — Telehealth: Payer: Self-pay | Admitting: Family Medicine

## 2013-07-14 DIAGNOSIS — Z79899 Other long term (current) drug therapy: Secondary | ICD-10-CM

## 2013-07-14 DIAGNOSIS — E785 Hyperlipidemia, unspecified: Secondary | ICD-10-CM

## 2013-07-14 DIAGNOSIS — E039 Hypothyroidism, unspecified: Secondary | ICD-10-CM

## 2013-07-14 NOTE — Telephone Encounter (Signed)
bloodwork orders ready pt notified on voicemail.

## 2013-07-14 NOTE — Telephone Encounter (Signed)
Lip liv tsh PLUS ov

## 2013-07-14 NOTE — Telephone Encounter (Signed)
Patient needs order for blood work. °

## 2013-07-14 NOTE — Telephone Encounter (Signed)
02/11/13 lipid, liver, tsh, bmp

## 2013-07-24 LAB — HEPATIC FUNCTION PANEL
ALT: 15 U/L (ref 0–35)
AST: 18 U/L (ref 0–37)
Albumin: 4 g/dL (ref 3.5–5.2)
Alkaline Phosphatase: 52 U/L (ref 39–117)
BILIRUBIN DIRECT: 0.1 mg/dL (ref 0.0–0.3)
Indirect Bilirubin: 0.3 mg/dL (ref 0.2–1.2)
Total Bilirubin: 0.4 mg/dL (ref 0.2–1.2)
Total Protein: 6.7 g/dL (ref 6.0–8.3)

## 2013-07-24 LAB — LIPID PANEL
CHOL/HDL RATIO: 3.6 ratio
CHOLESTEROL: 210 mg/dL — AB (ref 0–200)
HDL: 58 mg/dL (ref >39)
LDL CALC: 127 mg/dL — AB (ref 0–99)
Triglycerides: 126 mg/dL (ref <150)
VLDL: 25 mg/dL (ref 0–40)

## 2013-07-24 LAB — TSH: TSH: 3.937 u[IU]/mL (ref 0.350–4.500)

## 2013-08-06 ENCOUNTER — Ambulatory Visit (INDEPENDENT_AMBULATORY_CARE_PROVIDER_SITE_OTHER): Payer: BC Managed Care – PPO | Admitting: Family Medicine

## 2013-08-06 ENCOUNTER — Encounter: Payer: Self-pay | Admitting: Family Medicine

## 2013-08-06 VITALS — BP 138/88 | Ht 64.0 in | Wt 165.0 lb

## 2013-08-06 DIAGNOSIS — E079 Disorder of thyroid, unspecified: Secondary | ICD-10-CM

## 2013-08-06 DIAGNOSIS — E785 Hyperlipidemia, unspecified: Secondary | ICD-10-CM

## 2013-08-06 MED ORDER — LEVOTHYROXINE SODIUM 75 MCG PO TABS: 75.0000 ug | ORAL_TABLET | Freq: Every day | ORAL | Status: DC

## 2013-08-06 MED ORDER — PRAVASTATIN SODIUM 20 MG PO TABS: 20.0000 mg | ORAL_TABLET | Freq: Every day | ORAL | Status: DC

## 2013-08-06 NOTE — Progress Notes (Signed)
   Subjective:    Patient ID: Carol Estrada, female    DOB: [birth year], 48 y.o.   MRN: [id number]  Hyperlipidemia This is a chronic problem. Exacerbating diseases include hypothyroidism. There are no compliance problems.    Had bloodwork done on 07/24/13. Results for orders placed in visit on 07/14/13  LIPID PANEL      Result Value Ref Range   Cholesterol 210 (*) 0 - 200 mg/dL   Triglycerides 126  <150 mg/dL   HDL 58  >39 mg/dL   Total CHOL/HDL Ratio 3.6     VLDL 25  0 - 40 mg/dL   LDL Cholesterol 127 (*) 0 - 99 mg/dL  HEPATIC FUNCTION PANEL      Result Value Ref Range   Total Bilirubin 0.4  0.2 - 1.2 mg/dL   Bilirubin, Direct 0.1  0.0 - 0.3 mg/dL   Indirect Bilirubin 0.3  0.2 - 1.2 mg/dL   Alkaline Phosphatase 52  39 - 117 U/L   AST 18  0 - 37 U/L   ALT 15  0 - 35 U/L   Total Protein 6.7  6.0 - 8.3 g/dL   Albumin 4.0  3.5 - 5.2 g/dL  TSH      Result Value Ref Range   TSH 3.937  0.350 - 4.500 uIU/mL   Patient frustrated by ongoing weight gain. Was hoping a new thyroid dose would help things. Occasional fatigue at times.  Compliant with cholesterol medicine. No obvious side effects. Trying to watch fat in diet.  Patient notes intermittent question of a nodule in the left supraclavicular region. Somewhat worried about this.  Review of Systems No chest pain no headache no back pain no abdominal pain no change have some blood in stool    Objective:   Physical Exam Alert HEENT normal lungs clear heart rare rhythm thyroid nonpalpable supraclavicular region no obvious adenopathy.       Assessment & Plan:  Impression 1 hypothyroidism suboptimum control considering symptoms of #2 #2 obesity. #3 hyperlipidemia good control. #4 concern regarding possible mass none evident in the fact that it comes and goes is good and use. Discussed. Plan increase thyroid to 75 mcg maintain Pravachol the same symptomatic care discussed diet strongly encouraged as his exercise maintain other meds  WSL

## 2013-08-14 ENCOUNTER — Other Ambulatory Visit: Payer: Self-pay | Admitting: Family Medicine

## 2013-11-30 ENCOUNTER — Encounter: Payer: Self-pay | Admitting: Family Medicine

## 2014-01-12 ENCOUNTER — Other Ambulatory Visit: Payer: Self-pay

## 2014-01-12 MED ORDER — CLOBETASOL PROPIONATE 0.05 % EX OINT: TOPICAL_OINTMENT | CUTANEOUS | Status: DC

## 2014-01-20 ENCOUNTER — Encounter: Payer: Self-pay | Admitting: Family Medicine

## 2014-01-20 ENCOUNTER — Ambulatory Visit (INDEPENDENT_AMBULATORY_CARE_PROVIDER_SITE_OTHER): Payer: BC Managed Care – PPO | Admitting: Family Medicine

## 2014-01-20 VITALS — BP 120/80 | Temp 98.4°F | Ht 64.0 in | Wt 161.5 lb

## 2014-01-20 DIAGNOSIS — J329 Chronic sinusitis, unspecified: Secondary | ICD-10-CM

## 2014-01-20 MED ORDER — AZITHROMYCIN 250 MG PO TABS: ORAL_TABLET | ORAL | Status: DC

## 2014-01-20 NOTE — Progress Notes (Signed)
   Subjective:    Patient ID: Carol Estrada, female    DOB: 05-22-1965, 48 y.o.   MRN: [id number]  Sinusitis This is a new problem. The current episode started in the past 7 days. The problem is unchanged. There has been no fever. The pain is moderate. Associated symptoms include congestion, coughing, sneezing and a sore throat. Past treatments include acetaminophen (claritin, delsym ). The treatment provided no relief.   Patient states that she has no concerns at this time.  Nasal disch gunkay and worsening   Some infxn going on at thome and at work   This winter having dry heat from wod stove   Headache frontal in nature symptoms off-and-on   Review of Systems  HENT: Positive for congestion, sneezing and sore throat.   Respiratory: Positive for cough.    No vomiting no diarrhea no rash    Objective:   Physical Exam Alert mild malaise vital stable frontal mass or tenderness pharynx normal neck supple. Lungs clear heart regular in rhythm.       Assessment & Plan:  Impression acute rhinosinusitis plan antibiotics prescribed. Since Medicare discussed. Warning signs discussed. WSL

## 2014-02-09 ENCOUNTER — Telehealth: Payer: Self-pay | Admitting: Family Medicine

## 2014-02-09 DIAGNOSIS — E785 Hyperlipidemia, unspecified: Secondary | ICD-10-CM

## 2014-02-09 DIAGNOSIS — Z79899 Other long term (current) drug therapy: Secondary | ICD-10-CM

## 2014-02-09 NOTE — Telephone Encounter (Signed)
Pt is wanting to know if labs are needing for her 6 month check up on  02/12/14.  Last labs were: lipid,hepatic,tsh on 07/25/14

## 2014-02-09 NOTE — Telephone Encounter (Signed)
Left message on voicemail notifying patient that blood work has been ordered.  

## 2014-02-09 NOTE — Telephone Encounter (Signed)
Lip liv m7 

## 2014-02-09 NOTE — Telephone Encounter (Signed)
Pt is wanting to know if labs are needed for her 6 month check up On 02/12/14. Last labs were 07/24/13 lipid,hepatic,tsh.

## 2014-02-10 LAB — HEPATIC FUNCTION PANEL
ALK PHOS: 57 U/L (ref 39–117)
ALT: 15 U/L (ref 0–35)
AST: 17 U/L (ref 0–37)
Albumin: 3.6 g/dL (ref 3.5–5.2)
BILIRUBIN DIRECT: 0.1 mg/dL (ref 0.0–0.3)
BILIRUBIN INDIRECT: 0.3 mg/dL (ref 0.2–1.2)
BILIRUBIN TOTAL: 0.4 mg/dL (ref 0.2–1.2)
Total Protein: 6.6 g/dL (ref 6.0–8.3)

## 2014-02-10 LAB — BASIC METABOLIC PANEL
BUN: 13 mg/dL (ref 6–23)
CHLORIDE: 106 meq/L (ref 96–112)
CO2: 23 mEq/L (ref 19–32)
CREATININE: 0.75 mg/dL (ref 0.50–1.10)
Calcium: 9 mg/dL (ref 8.4–10.5)
GLUCOSE: 79 mg/dL (ref 70–99)
POTASSIUM: 4.6 meq/L (ref 3.5–5.3)
Sodium: 137 mEq/L (ref 135–145)

## 2014-02-10 LAB — LIPID PANEL
CHOL/HDL RATIO: 3.1 ratio
Cholesterol: 195 mg/dL (ref 0–200)
HDL: 63 mg/dL (ref >39)
LDL CALC: 115 mg/dL — AB (ref 0–99)
TRIGLYCERIDES: 84 mg/dL (ref <150)
VLDL: 17 mg/dL (ref 0–40)

## 2014-02-12 ENCOUNTER — Encounter: Payer: Self-pay | Admitting: Family Medicine

## 2014-02-12 ENCOUNTER — Ambulatory Visit (INDEPENDENT_AMBULATORY_CARE_PROVIDER_SITE_OTHER): Payer: BLUE CROSS/BLUE SHIELD | Admitting: Family Medicine

## 2014-02-12 VITALS — BP 132/82 | Ht 64.0 in | Wt 160.0 lb

## 2014-02-12 DIAGNOSIS — E079 Disorder of thyroid, unspecified: Secondary | ICD-10-CM

## 2014-02-12 DIAGNOSIS — R21 Rash and other nonspecific skin eruption: Secondary | ICD-10-CM

## 2014-02-12 DIAGNOSIS — E785 Hyperlipidemia, unspecified: Secondary | ICD-10-CM

## 2014-02-12 NOTE — Progress Notes (Signed)
   Subjective:    Patient ID: Carol Estrada, female    DOB: [birth year], 49 y.o.   MRN: [id number]  HPI Patient is here today for a 6 month check up.  She got BW done.  No concerns.    Results for orders placed or performed in visit on 02/09/14  Lipid panel  Result Value Ref Range   Cholesterol 195 0 - 200 mg/dL   Triglycerides 84 <150 mg/dL   HDL 63 >39 mg/dL   Total CHOL/HDL Ratio 3.1 Ratio   VLDL 17 0 - 40 mg/dL   LDL Cholesterol 115 (H) 0 - 99 mg/dL  Hepatic function panel  Result Value Ref Range   Total Bilirubin 0.4 0.2 - 1.2 mg/dL   Bilirubin, Direct 0.1 0.0 - 0.3 mg/dL   Indirect Bilirubin 0.3 0.2 - 1.2 mg/dL   Alkaline Phosphatase 57 39 - 117 U/L   AST 17 0 - 37 U/L   ALT 15 0 - 35 U/L   Total Protein 6.6 6.0 - 8.3 g/dL   Albumin 3.6 3.5 - 5.2 g/dL  Basic metabolic panel  Result Value Ref Range   Sodium 137 135 - 145 mEq/L   Potassium 4.6 3.5 - 5.3 mEq/L   Chloride 106 96 - 112 mEq/L   CO2 23 19 - 32 mEq/L   Glucose, Bld 79 70 - 99 mg/dL   BUN 13 6 - 23 mg/dL   Creat 0.75 0.50 - 1.10 mg/dL   Calcium 9.0 8.4 - 10.5 mg/dL   Lipids not the best, has cut down fried food, and has cut down carbs,  Patient has history of hypothyroidism. Blood work 6 months ago good. Claims compliance with medications.  Has history of eczema in the past. Has gone and developed lichen sclerosis. This identified via a biopsy of rash on her buttock. Patient expresses high frustration regarding this. She would like to see potentially dermatology specialist for this   exercise not much, tries to walk a lot,  Tmill, at home does not always use.  Review of Systems No headache no chest pain no abdominal pain no change in bowel habits no blood in stool ROS otherwise negative    Objective:   Physical Exam Alert vitals stable. HEENT normal. Thyroid nonpalpable. Lungs clear. Heart regular rate and rhythm. Skin mild eczema noted.       Assessment & Plan:  Impression 1 hyperlipidemia  good control discussed #2 hypothyroidism good control discussed #3 lichen sclerosis long discussion held plan dermatology referral per patient request. Diet exercise discussed. Medications refilled. Follow-up in 6 months. WSL

## 2014-02-15 ENCOUNTER — Other Ambulatory Visit: Payer: Self-pay | Admitting: *Deleted

## 2014-02-15 ENCOUNTER — Other Ambulatory Visit: Payer: Self-pay | Admitting: Family Medicine

## 2014-02-15 MED ORDER — PRAVASTATIN SODIUM 20 MG PO TABS: 20.0000 mg | ORAL_TABLET | Freq: Every day | ORAL | Status: DC

## 2014-03-11 ENCOUNTER — Telehealth: Payer: Self-pay | Admitting: *Deleted

## 2014-03-11 NOTE — Telephone Encounter (Signed)
I am a specialist in this area. Lichen sclerosus tends to be a recurrent issue such as psoriasis that needs to be treated intermittently. As long as she is having good response to Temovate cream intermittently there really is nothing else to be done.

## 2014-03-11 NOTE — Telephone Encounter (Signed)
Pt has Lichen sclerosis had an outbreak in Dec. That lasted about 3 weeks, used the temovate cream and it did go away. She asked for a name of a specialist to have on hand in case she may want to schedule appointment.

## 2014-03-15 NOTE — Telephone Encounter (Signed)
Left message to call.

## 2014-03-16 NOTE — Telephone Encounter (Signed)
Pt informed with the below note. 

## 2014-07-05 ENCOUNTER — Telehealth: Payer: Self-pay | Admitting: Family Medicine

## 2014-07-05 DIAGNOSIS — E039 Hypothyroidism, unspecified: Secondary | ICD-10-CM

## 2014-07-05 DIAGNOSIS — Z131 Encounter for screening for diabetes mellitus: Secondary | ICD-10-CM

## 2014-07-05 DIAGNOSIS — Z79899 Other long term (current) drug therapy: Secondary | ICD-10-CM

## 2014-07-05 DIAGNOSIS — E785 Hyperlipidemia, unspecified: Secondary | ICD-10-CM

## 2014-07-05 NOTE — Telephone Encounter (Signed)
Left message on voicemail notifying patient bloodwork has been ordered.  

## 2014-07-05 NOTE — Telephone Encounter (Signed)
Lip liv tsh glu

## 2014-07-05 NOTE — Telephone Encounter (Signed)
Lipid, liver, bmp on 02/09/14

## 2014-07-05 NOTE — Telephone Encounter (Signed)
Patient requesting order for blood work for appointment in July.

## 2014-07-09 ENCOUNTER — Encounter: Payer: Self-pay | Admitting: Family Medicine

## 2014-07-09 ENCOUNTER — Ambulatory Visit (INDEPENDENT_AMBULATORY_CARE_PROVIDER_SITE_OTHER): Payer: BLUE CROSS/BLUE SHIELD | Admitting: Family Medicine

## 2014-07-09 VITALS — BP 132/88 | Temp 98.6°F | Ht 64.0 in | Wt 164.5 lb

## 2014-07-09 DIAGNOSIS — J329 Chronic sinusitis, unspecified: Secondary | ICD-10-CM

## 2014-07-09 MED ORDER — AZITHROMYCIN 250 MG PO TABS: ORAL_TABLET | ORAL | Status: DC

## 2014-07-09 NOTE — Progress Notes (Signed)
   Subjective:    Patient ID: Carol Estrada, female    DOB: 11-22-1965, 49 y.o.   MRN: [id number]  Cough This is a new problem. The current episode started in the past 7 days. The problem has been gradually worsening. The cough is non-productive. Associated symptoms include headaches and a sore throat. Associated symptoms comments: Itching ears, sneezing. Nothing aggravates the symptoms. Treatments tried: Claritin. The treatment provided no relief.   Patient states no other concerns this visit.  Runny nose and sneezing and cough  Took claritin and the flonase  Lots of s  Cough and cong   Scratchy throat   Feeling pun and achey and    Review of Systems  HENT: Positive for sore throat.   Respiratory: Positive for cough.   Neurological: Positive for headaches.   no vomiting no diarrhea     Objective:   Physical Exam  Alert mild malaise. HEENT moderate nasal congestion frontal tenderness pharynx normal. Neck supple. Lungs clear. Heart regular rate and rhythm.       Assessment & Plan:  Impression 1 rhinosinusitis plan antibiosis prescribed. Symptomatic care discussed. Warning signs discussed. WSL

## 2014-07-21 ENCOUNTER — Ambulatory Visit (INDEPENDENT_AMBULATORY_CARE_PROVIDER_SITE_OTHER): Payer: BLUE CROSS/BLUE SHIELD | Admitting: Gynecology

## 2014-07-21 ENCOUNTER — Encounter: Payer: Self-pay | Admitting: Gynecology

## 2014-07-21 ENCOUNTER — Other Ambulatory Visit (HOSPITAL_COMMUNITY)
Admission: RE | Admit: 2014-07-21 | Discharge: 2014-07-21 | Disposition: A | Discharge status: Home / Self Care | Payer: BLUE CROSS/BLUE SHIELD | Source: Ambulatory Visit | Attending: Gynecology | Admitting: Gynecology

## 2014-07-21 VITALS — BP 120/76 | Ht 64.0 in | Wt 164.0 lb

## 2014-07-21 DIAGNOSIS — Z01419 Encounter for gynecological examination (general) (routine) without abnormal findings: Secondary | ICD-10-CM | POA: Insufficient documentation

## 2014-07-21 MED ORDER — CLOBETASOL PROPIONATE 0.05 % EX OINT: TOPICAL_OINTMENT | CUTANEOUS | Status: DC

## 2014-07-21 MED ORDER — TRIAMTERENE-HCTZ 37.5-25 MG PO CAPS: 1.0000 | ORAL_CAPSULE | ORAL | Status: DC

## 2014-07-21 MED ORDER — NORETHINDRONE-ETH ESTRADIOL 1-35 MG-MCG PO TABS: 1.0000 | ORAL_TABLET | Freq: Every day | ORAL | Status: DC

## 2014-07-21 MED ORDER — ACYCLOVIR 400 MG PO TABS: 400.0000 mg | ORAL_TABLET | Freq: Two times a day (BID) | ORAL | Status: DC

## 2014-07-21 NOTE — Addendum Note (Signed)
Addended by: Nelva Nay on: 07/21/2014 09:59 AM   Modules accepted: Orders

## 2014-07-21 NOTE — Patient Instructions (Signed)
You may obtain a copy of any labs that were done today by logging onto MyChart as outlined in the instructions provided with your AVS (after visit summary). The office will not call with normal lab results but certainly if there are any significant abnormalities then we will contact you.   Health Maintenance, Female A healthy lifestyle and preventative care can promote health and wellness.  Maintain regular health, dental, and eye exams.  Eat a healthy diet. Foods like vegetables, fruits, whole grains, low-fat dairy products, and lean protein foods contain the nutrients you need without too many calories. Decrease your intake of foods high in solid fats, added sugars, and salt. Get information about a proper diet from your caregiver, if necessary.  Regular physical exercise is one of the most important things you can do for your health. Most adults should get at least 150 minutes of moderate-intensity exercise (any activity that increases your heart rate and causes you to sweat) each week. In addition, most adults need muscle-strengthening exercises on 2 or more days a week.   Maintain a healthy weight. The body mass index (BMI) is a screening tool to identify possible weight problems. It provides an estimate of body fat based on height and weight. Your caregiver can help determine your BMI, and can help you achieve or maintain a healthy weight. For adults 20 years and older:  A BMI below 18.5 is considered underweight.  A BMI of 18.5 to 24.9 is normal.  A BMI of 25 to 29.9 is considered overweight.  A BMI of 30 and above is considered obese.  Maintain normal blood lipids and cholesterol by exercising and minimizing your intake of saturated fat. Eat a balanced diet with plenty of fruits and vegetables. Blood tests for lipids and cholesterol should begin at age 61 and be repeated every 5 years. If your lipid or cholesterol levels are high, you are over 50, or you are a high risk for heart  disease, you may need your cholesterol levels checked more frequently.Ongoing high lipid and cholesterol levels should be treated with medicines if diet and exercise are not effective.  If you smoke, find out from your caregiver how to quit. If you do not use tobacco, do not start.  Lung cancer screening is recommended for adults aged 33 80 years who are at high risk for developing lung cancer because of a history of smoking. Yearly low-dose computed tomography (CT) is recommended for people who have at least a 30-pack-year history of smoking and are a current smoker or have quit within the past 15 years. A pack year of smoking is smoking an average of 1 pack of cigarettes a day for 1 year (for example: 1 pack a day for 30 years or 2 packs a day for 15 years). Yearly screening should continue until the smoker has stopped smoking for at least 15 years. Yearly screening should also be stopped for people who develop a health problem that would prevent them from having lung cancer treatment.  If you are pregnant, do not drink alcohol. If you are breastfeeding, be very cautious about drinking alcohol. If you are not pregnant and choose to drink alcohol, do not exceed 1 drink per day. One drink is considered to be 12 ounces (355 mL) of beer, 5 ounces (148 mL) of wine, or 1.5 ounces (44 mL) of liquor.  Avoid use of street drugs. Do not share needles with anyone. Ask for help if you need support or instructions about stopping  the use of drugs.  High blood pressure causes heart disease and increases the risk of stroke. Blood pressure should be checked at least every 1 to 2 years. Ongoing high blood pressure should be treated with medicines, if weight loss and exercise are not effective.  If you are 59 to 47 years old, ask your caregiver if you should take aspirin to prevent strokes.  Diabetes screening involves taking a blood sample to check your fasting blood sugar level. This should be done once every 3  years, after age 91, if you are within normal weight and without risk factors for diabetes. Testing should be considered at a younger age or be carried out more frequently if you are overweight and have at least 1 risk factor for diabetes.  Breast cancer screening is essential preventative care for women. You should practice "breast self-awareness." This means understanding the normal appearance and feel of your breasts and may include breast self-examination. Any changes detected, no matter how small, should be reported to a caregiver. Women in their 66s and 30s should have a clinical breast exam (CBE) by a caregiver as part of a regular health exam every 1 to 3 years. After age 101, women should have a CBE every year. Starting at age 100, women should consider having a mammogram (breast X-ray) every year. Women who have a family history of breast cancer should talk to their caregiver about genetic screening. Women at a high risk of breast cancer should talk to their caregiver about having an MRI and a mammogram every year.  Breast cancer gene (BRCA)-related cancer risk assessment is recommended for women who have family members with BRCA-related cancers. BRCA-related cancers include breast, ovarian, tubal, and peritoneal cancers. Having family members with these cancers may be associated with an increased risk for harmful changes (mutations) in the breast cancer genes BRCA1 and BRCA2. Results of the assessment will determine the need for genetic counseling and BRCA1 and BRCA2 testing.  The Pap test is a screening test for cervical cancer. Women should have a Pap test starting at age 57. Between ages 25 and 35, Pap tests should be repeated every 2 years. Beginning at age 37, you should have a Pap test every 3 years as long as the past 3 Pap tests have been normal. If you had a hysterectomy for a problem that was not cancer or a condition that could lead to cancer, then you no longer need Pap tests. If you are  between ages 50 and 76, and you have had normal Pap tests going back 10 years, you no longer need Pap tests. If you have had past treatment for cervical cancer or a condition that could lead to cancer, you need Pap tests and screening for cancer for at least 20 years after your treatment. If Pap tests have been discontinued, risk factors (such as a new sexual partner) need to be reassessed to determine if screening should be resumed. Some women have medical problems that increase the chance of getting cervical cancer. In these cases, your caregiver may recommend more frequent screening and Pap tests.  The human papillomavirus (HPV) test is an additional test that may be used for cervical cancer screening. The HPV test looks for the virus that can cause the cell changes on the cervix. The cells collected during the Pap test can be tested for HPV. The HPV test could be used to screen women aged 44 years and older, and should be used in women of any age  who have unclear Pap test results. After the age of 55, women should have HPV testing at the same frequency as a Pap test.  Colorectal cancer can be detected and often prevented. Most routine colorectal cancer screening begins at the age of 44 and continues through age 20. However, your caregiver may recommend screening at an earlier age if you have risk factors for colon cancer. On a yearly basis, your caregiver may provide home test kits to check for hidden blood in the stool. Use of a small camera at the end of a tube, to directly examine the colon (sigmoidoscopy or colonoscopy), can detect the earliest forms of colorectal cancer. Talk to your caregiver about this at age 86, when routine screening begins. Direct examination of the colon should be repeated every 5 to 10 years through age 13, unless early forms of pre-cancerous polyps or small growths are found.  Hepatitis C blood testing is recommended for all people born from [birth year] through 1963 and any  individual with known risks for hepatitis C.  Practice safe sex. Use condoms and avoid high-risk sexual practices to reduce the spread of sexually transmitted infections (STIs). Sexually active women aged 36 and younger should be checked for Chlamydia, which is a common sexually transmitted infection. Older women with new or multiple partners should also be tested for Chlamydia. Testing for other STIs is recommended if you are sexually active and at increased risk.  Osteoporosis is a disease in which the bones lose minerals and strength with aging. This can result in serious bone fractures. The risk of osteoporosis can be identified using a bone density scan. Women ages 20 and over and women at risk for fractures or osteoporosis should discuss screening with their caregivers. Ask your caregiver whether you should be taking a calcium supplement or vitamin D to reduce the rate of osteoporosis.  Menopause can be associated with physical symptoms and risks. Hormone replacement therapy is available to decrease symptoms and risks. You should talk to your caregiver about whether hormone replacement therapy is right for you.  Use sunscreen. Apply sunscreen liberally and repeatedly throughout the day. You should seek shade when your shadow is shorter than you. Protect yourself by wearing long sleeves, pants, a wide-brimmed hat, and sunglasses year round, whenever you are outdoors.  Notify your caregiver of new moles or changes in moles, especially if there is a change in shape or color. Also notify your caregiver if a mole is larger than the size of a pencil eraser.  Stay current with your immunizations. Document Released: 07/31/2010 Document Revised: 05/12/2012 Document Reviewed: 07/31/2010 [facility] Patient Information 2014 [employer].

## 2014-07-21 NOTE — Progress Notes (Signed)
Carol Estrada 02-24-1965 [id number]        49 y.o.  G1P1001 for annual exam.  Doing well without complaints.  Past medical history,surgical history, problem list, medications, allergies, family history and social history were all reviewed and documented as reviewed in the EPIC chart.  ROS:  Performed with pertinent positives and negatives included in the history, assessment and plan.   Additional significant findings :  none   Exam: Kim Counsellor Vitals:   07/21/14 0919  BP: 120/76  Height: 5\' 4"  (1.626 m)  Weight: 164 lb (74.39 kg)   General appearance:  Normal affect, orientation and appearance. Skin: Grossly normal HEENT: Without gross lesions.  No cervical or supraclavicular adenopathy. Thyroid normal.  Lungs:  Clear without wheezing, rales or rhonchi Cardiac: RR, without RMG Abdominal:  Soft, nontender, without masses, guarding, rebound, organomegaly or hernia Breasts:  Examined lying and sitting without masses, retractions, discharge or axillary adenopathy. Pelvic:  Ext/BUS/vagina normal  Cervix normal. Pap smear done  Uterus axial to retroverted, normal size, shape and contour, midline and mobile nontender   Adnexa  Without masses or tenderness    Anus and perineum  Normal   Rectovaginal  Normal sphincter tone without palpated masses or tenderness.    Assessment/Plan:  49 y.o. G82P1001 female for annual exam with regular menses, oral contraceptives.   1. Oral contraceptives. Patient continues on Ortho-Novum 1/35 equivalents for menstrual regulation in contraception. Wants to continue. Not being followed for any vascular issues and never smoked. Increased risk of blood clots to include stroke heart attack DVT reviewed. Patient understands and accepts and refill 1 year provided. 2. History of occasional herpes labialis.  Takes to 400 mg acyclovir which seems to knock it out. #30 provided. 3. Lichen sclerosis. Biopsy-proven by Dr. Cherylann Banas at her left buttocks area.  Exam is normal. Occasionally uses Temovate 0.05% cream when she feels irritated. Refill 1 provided. 4. Uses Dyazide once weekly when feels swollen. Has done this for years per Dr. Cherylann Banas. #30 with 3 refills provided. 5. Pap smear 2013. Pap smear done today. No history of significant abnormal Pap smears. 6. Mammography 04/2013. Mammographic recommendations reviewed to include ACOG annual recommendations. Patient plans to schedule later in the summer. SBE monthly reviewed. 7. Health maintenance. No routine blood work done as patient has this done at her primary physician's office. Follow up in one year, sooner as needed.   Anastasio Auerbach MD, 9:47 AM 07/21/2014

## 2014-07-22 LAB — URINALYSIS W MICROSCOPIC + REFLEX CULTURE
BACTERIA UA: NONE SEEN
Bilirubin Urine: NEGATIVE
CRYSTALS: NONE SEEN
Casts: NONE SEEN
Glucose, UA: NEGATIVE mg/dL
Hgb urine dipstick: NEGATIVE
KETONES UR: NEGATIVE mg/dL
LEUKOCYTES UA: NEGATIVE
NITRITE: NEGATIVE
Protein, ur: NEGATIVE mg/dL
SPECIFIC GRAVITY, URINE: 1.005 (ref 1.005–1.030)
Squamous Epithelial / LPF: NONE SEEN
UROBILINOGEN UA: 0.2 mg/dL (ref 0.0–1.0)
pH: 7.5 (ref 5.0–8.0)

## 2014-07-23 LAB — CYTOLOGY - PAP

## 2014-07-28 LAB — TSH: TSH: 3.15 u[IU]/mL (ref 0.450–4.500)

## 2014-07-28 LAB — LIPID PANEL
Chol/HDL Ratio: 3.5 ratio (ref 0.0–4.4)
Cholesterol, Total: 211 mg/dL — ABNORMAL HIGH (ref 100–199)
HDL: 60 mg/dL
LDL Calculated: 133 mg/dL — ABNORMAL HIGH (ref 0–99)
Triglycerides: 92 mg/dL (ref 0–149)
VLDL Cholesterol Cal: 18 mg/dL (ref 5–40)

## 2014-07-28 LAB — GLUCOSE, RANDOM: Glucose: 84 mg/dL (ref 65–99)

## 2014-07-28 LAB — HEPATIC FUNCTION PANEL
ALT: 16 [IU]/L (ref 0–32)
AST: 18 [IU]/L (ref 0–40)
Albumin: 4.1 g/dL (ref 3.5–5.5)
Alkaline Phosphatase: 65 [IU]/L (ref 39–117)
Bilirubin Total: 0.3 mg/dL (ref 0.0–1.2)
Bilirubin, Direct: 0.05 mg/dL (ref 0.00–0.40)
Total Protein: 6.7 g/dL (ref 6.0–8.5)

## 2014-08-11 ENCOUNTER — Ambulatory Visit (INDEPENDENT_AMBULATORY_CARE_PROVIDER_SITE_OTHER): Payer: BLUE CROSS/BLUE SHIELD | Admitting: Family Medicine

## 2014-08-11 ENCOUNTER — Encounter: Payer: Self-pay | Admitting: Family Medicine

## 2014-08-11 VITALS — BP 110/80 | Ht 64.0 in | Wt 164.2 lb

## 2014-08-11 DIAGNOSIS — E039 Hypothyroidism, unspecified: Secondary | ICD-10-CM

## 2014-08-11 DIAGNOSIS — J309 Allergic rhinitis, unspecified: Secondary | ICD-10-CM | POA: Diagnosis not present

## 2014-08-11 DIAGNOSIS — E785 Hyperlipidemia, unspecified: Secondary | ICD-10-CM

## 2014-08-11 MED ORDER — LEVOTHYROXINE SODIUM 88 MCG PO TABS: 88.0000 ug | ORAL_TABLET | Freq: Every day | ORAL | Status: DC

## 2014-08-11 MED ORDER — PRAVASTATIN SODIUM 20 MG PO TABS: 20.0000 mg | ORAL_TABLET | Freq: Every day | ORAL | Status: DC

## 2014-08-11 MED ORDER — FLUTICASONE PROPIONATE 50 MCG/ACT NA SUSP: NASAL | Status: DC

## 2014-08-11 NOTE — Progress Notes (Signed)
   Subjective:    Patient ID: Carol Estrada, female    DOB: 1965/12/03, 49 y.o.   MRN: [id number]  Hyperlipidemia This is a chronic problem. The current episode started more than 1 year ago. There are no known factors aggravating her hyperlipidemia. Current antihyperlipidemic treatment includes statins. The current treatment provides moderate improvement of lipids. There are no compliance problems.  There are no known risk factors for coronary artery disease.   Patient has no concerns at this time.   Results for orders placed or performed in visit on 07/21/14  Urinalysis w microscopic + reflex cultur  Result Value Ref Range   Color, Urine YELLOW YELLOW   APPearance CLEAR CLEAR   Specific Gravity, Urine 1.005 1.005 - 1.030   pH 7.5 5.0 - 8.0   Glucose, UA NEG NEG mg/dL   Bilirubin Urine NEG NEG   Ketones, ur NEG NEG mg/dL   Hgb urine dipstick NEG NEG   Protein, ur NEG NEG mg/dL   Urobilinogen, UA 0.2 0.0 - 1.0 mg/dL   Nitrite NEG NEG   Leukocytes, UA NEG NEG   Squamous Epithelial / LPF NONE SEEN RARE   Crystals NONE SEEN NONE SEEN   Casts NONE SEEN NONE SEEN   WBC, UA 0-2 <3 WBC/hpf   RBC / HPF 0-2 <3 RBC/hpf   Bacteria, UA NONE SEEN RARE  Cytology - PAP  Result Value Ref Range   CYTOLOGY - PAP PAP RESULT    Patient notes ongoing chronic rhinitis. States Flonase definitely helps. Feels like she needs to stay on this medicine year-round at this point.  Challenge with fatigue at times. Also weight gain. Compliant with thyroid medicine. TSH in the threes.  Compliant with lipid medicine. No obvious side effects. Has cut down fat intake.   Review of Systems No headache no chest pain no back pain no abdominal pain no change in bowel habits    Objective:   Physical Exam  Alert no acute distress lungs clear. Heart regular in rhythm. H&T normal. Thyroid nonpalpable.      Assessment & Plan:  Impression 1 hyperlipidemia improved discussed #2 allergic rhinitis ongoing  discussed #3 hypothyroidism somewhat suboptimum control discussed plan increase levothyroid. Maintain same dose of Pravachol. Diet exercise discussed. Flonase refilled. WSL

## 2014-08-30 ENCOUNTER — Other Ambulatory Visit: Payer: Self-pay

## 2014-08-30 DIAGNOSIS — Z1231 Encounter for screening mammogram for malignant neoplasm of breast: Secondary | ICD-10-CM

## 2014-10-18 ENCOUNTER — Ambulatory Visit: Payer: BLUE CROSS/BLUE SHIELD

## 2014-10-19 ENCOUNTER — Ambulatory Visit
Admission: RE | Admit: 2014-10-19 | Discharge: 2014-10-19 | Disposition: A | Discharge status: Home / Self Care | Payer: BLUE CROSS/BLUE SHIELD | Source: Ambulatory Visit

## 2014-10-19 DIAGNOSIS — Z1231 Encounter for screening mammogram for malignant neoplasm of breast: Secondary | ICD-10-CM

## 2015-01-26 ENCOUNTER — Telehealth: Payer: Self-pay | Admitting: Family Medicine

## 2015-01-26 DIAGNOSIS — E785 Hyperlipidemia, unspecified: Secondary | ICD-10-CM

## 2015-01-26 DIAGNOSIS — E039 Hypothyroidism, unspecified: Secondary | ICD-10-CM

## 2015-01-26 DIAGNOSIS — Z139 Encounter for screening, unspecified: Secondary | ICD-10-CM

## 2015-01-26 DIAGNOSIS — Z79899 Other long term (current) drug therapy: Secondary | ICD-10-CM

## 2015-01-26 NOTE — Telephone Encounter (Signed)
Pt needs bw with thyroid for appt on the 13th  [facility] labs  07/27/14 Lip, Hep, TSH, Glucose, random

## 2015-01-26 NOTE — Telephone Encounter (Signed)
Orders put in. Pt notified.  

## 2015-01-26 NOTE — Telephone Encounter (Signed)
Rep all the same 

## 2015-02-09 LAB — LIPID PANEL
Chol/HDL Ratio: 4 ratio units (ref 0.0–4.4)
Cholesterol, Total: 229 mg/dL — ABNORMAL HIGH (ref 100–199)
HDL: 57 mg/dL (ref >39)
LDL Calculated: 137 mg/dL — ABNORMAL HIGH (ref 0–99)
TRIGLYCERIDES: 175 mg/dL — AB (ref 0–149)
VLDL CHOLESTEROL CAL: 35 mg/dL (ref 5–40)

## 2015-02-09 LAB — HEPATIC FUNCTION PANEL
ALK PHOS: 62 IU/L (ref 39–117)
ALT: 13 IU/L (ref 0–32)
AST: 15 IU/L (ref 0–40)
Albumin: 3.9 g/dL (ref 3.5–5.5)
BILIRUBIN, DIRECT: 0.07 mg/dL (ref 0.00–0.40)
Bilirubin Total: 0.3 mg/dL (ref 0.0–1.2)
Total Protein: 6.6 g/dL (ref 6.0–8.5)

## 2015-02-09 LAB — GLUCOSE, RANDOM: GLUCOSE: 86 mg/dL (ref 65–99)

## 2015-02-09 LAB — TSH: TSH: 2.98 u[IU]/mL (ref 0.450–4.500)

## 2015-02-11 ENCOUNTER — Encounter: Payer: Self-pay | Admitting: Family Medicine

## 2015-02-11 ENCOUNTER — Ambulatory Visit (INDEPENDENT_AMBULATORY_CARE_PROVIDER_SITE_OTHER): Payer: BLUE CROSS/BLUE SHIELD | Admitting: Family Medicine

## 2015-02-11 VITALS — BP 132/84 | Ht 64.0 in | Wt 166.2 lb

## 2015-02-11 DIAGNOSIS — E785 Hyperlipidemia, unspecified: Secondary | ICD-10-CM

## 2015-02-11 DIAGNOSIS — E039 Hypothyroidism, unspecified: Secondary | ICD-10-CM

## 2015-02-11 MED ORDER — PRAVASTATIN SODIUM 20 MG PO TABS: 20.0000 mg | ORAL_TABLET | Freq: Every day | ORAL | Status: DC

## 2015-02-11 MED ORDER — LEVOTHYROXINE SODIUM 88 MCG PO TABS: 88.0000 ug | ORAL_TABLET | Freq: Every day | ORAL | Status: DC

## 2015-02-11 NOTE — Progress Notes (Signed)
   Subjective:    Patient ID: Carol Estrada, female    DOB: Apr 06, 1965, 50 y.o.   MRN: [id number]  Hyperlipidemia This is a chronic problem. The current episode started more than 1 year ago. Treatments tried: pravachol. There are no compliance problems.     Results for orders placed or performed in visit on 01/26/15  Lipid panel  Result Value Ref Range   Cholesterol, Total 229 (H) 100 - 199 mg/dL   Triglycerides 175 (H) 0 - 149 mg/dL   HDL 57 >39 mg/dL   VLDL Cholesterol Cal 35 5 - 40 mg/dL   LDL Calculated 137 (H) 0 - 99 mg/dL   Chol/HDL Ratio 4.0 0.0 - 4.4 ratio units  Hepatic function panel  Result Value Ref Range   Total Protein 6.6 6.0 - 8.5 g/dL   Albumin 3.9 3.5 - 5.5 g/dL   Bilirubin Total 0.3 0.0 - 1.2 mg/dL   Bilirubin, Direct 0.07 0.00 - 0.40 mg/dL   Alkaline Phosphatase 62 39 - 117 IU/L   AST 15 0 - 40 IU/L   ALT 13 0 - 32 IU/L  TSH  Result Value Ref Range   TSH 2.980 0.450 - 4.500 uIU/mL  Glucose, random  Result Value Ref Range   Glucose 86 65 - 99 mg/dL   Not exercising all that much this winter, does not use in house equipment Lipid Medications are reviewed. Does not miss a dose compliant. With her medicine. No obvious side effects. As noted not exercising.   Review of Systems No headache no chest pain no back pain abdominal pain no change in bowel habits    Objective:   Physical Exam Alert vital stable lungs clear. Heart regular in rhythm H&T normal       Assessment & Plan:  Impression 1 hyperlipidemia good control due to high HDL #2 hypothyroidism good control discussed plan maintain all same meds. Diet exercise discussed recheck in 6 months WSL

## 2015-04-04 ENCOUNTER — Encounter: Payer: Self-pay | Admitting: Family Medicine

## 2015-04-04 ENCOUNTER — Ambulatory Visit (INDEPENDENT_AMBULATORY_CARE_PROVIDER_SITE_OTHER): Payer: BLUE CROSS/BLUE SHIELD | Admitting: Family Medicine

## 2015-04-04 VITALS — BP 118/82 | Ht 64.0 in | Wt 168.0 lb

## 2015-04-04 DIAGNOSIS — L259 Unspecified contact dermatitis, unspecified cause: Secondary | ICD-10-CM

## 2015-04-04 MED ORDER — PREDNISONE 20 MG PO TABS: ORAL_TABLET | ORAL | Status: DC

## 2015-04-04 NOTE — Progress Notes (Signed)
   Subjective:    Patient ID: Carol Estrada, female    DOB: August 02, 1965, 50 y.o.   MRN: [id number]  HPI Patient has c/o rash on legs and arms for one week- itches  Benadryl cr prn   Aloe lidocaine   Over-the-counter ointments and creams or not working.  Patient didn't get outdoors worked a lot and thinks made gotten into something she is allergic to.  Also had shaved her legs and developed a rash soon after that, however she does agree that she often shaves her legs without any difficulty    Combo has nt helped  Review of Systems No headache no chest pain no back pain ROS otherwise negative    Objective:   Physical Exam  Alert vitals stable lungs clear heart rare rhythm legs bilateral hypertrophic patchy rash similar on in her arms the last several      Assessment & Plan:  Impression contact dermatitis approximate 8-questions answered remarkably concerning this, 15 minutes spent most in discussion plan prednisone taper. Local measures discussed WSL

## 2015-04-18 ENCOUNTER — Other Ambulatory Visit: Payer: Self-pay | Admitting: Family Medicine

## 2015-04-19 ENCOUNTER — Encounter: Payer: Self-pay | Admitting: Family Medicine

## 2015-04-19 ENCOUNTER — Other Ambulatory Visit: Payer: Self-pay | Admitting: *Deleted

## 2015-04-19 MED ORDER — MOMETASONE FUROATE 0.1 % EX CREA: 1.0000 "application " | TOPICAL_CREAM | Freq: Every day | CUTANEOUS | Status: DC

## 2015-04-19 NOTE — Telephone Encounter (Signed)
Discussed with pt. Elocon cream sent to [facility]. Pt to call back if not better in 2 weeks for a dermatology referral.

## 2015-04-28 ENCOUNTER — Other Ambulatory Visit: Payer: Self-pay

## 2015-04-28 MED ORDER — TRIAMTERENE-HCTZ 37.5-25 MG PO TABS: 1.0000 | ORAL_TABLET | Freq: Every day | ORAL | Status: DC

## 2015-06-22 ENCOUNTER — Encounter: Payer: Self-pay | Admitting: Family Medicine

## 2015-06-22 MED ORDER — MOMETASONE FUROATE 0.1 % EX CREA
TOPICAL_CREAM | CUTANEOUS | Status: DC
Start: 2015-06-22 — End: 2016-02-21

## 2015-07-05 ENCOUNTER — Telehealth: Payer: Self-pay | Admitting: Family Medicine

## 2015-07-05 DIAGNOSIS — E039 Hypothyroidism, unspecified: Secondary | ICD-10-CM

## 2015-07-05 DIAGNOSIS — E785 Hyperlipidemia, unspecified: Secondary | ICD-10-CM

## 2015-07-05 DIAGNOSIS — Z79899 Other long term (current) drug therapy: Secondary | ICD-10-CM

## 2015-07-05 NOTE — Telephone Encounter (Signed)
bw orders for appt please  Last labs 02/08/15 Lip, Hep, TSH, Glucose random

## 2015-07-06 NOTE — Telephone Encounter (Signed)
Blood work ordered in EPIC. Patient notified. 

## 2015-07-06 NOTE — Telephone Encounter (Signed)
Lip hep tsh met 7

## 2015-07-06 NOTE — Telephone Encounter (Signed)
Plus 6 mo f u not due til July mid as far as b w and f u visit unless pt wants to do earlier

## 2015-07-22 ENCOUNTER — Encounter: Payer: BLUE CROSS/BLUE SHIELD | Admitting: Gynecology

## 2015-08-11 ENCOUNTER — Encounter: Payer: BLUE CROSS/BLUE SHIELD | Admitting: Gynecology

## 2015-08-17 LAB — LIPID PANEL
CHOLESTEROL TOTAL: 233 mg/dL — AB (ref 100–199)
Chol/HDL Ratio: 4.4 ratio units (ref 0.0–4.4)
HDL: 53 mg/dL (ref >39)
LDL Calculated: 145 mg/dL — ABNORMAL HIGH (ref 0–99)
Triglycerides: 174 mg/dL — ABNORMAL HIGH (ref 0–149)
VLDL CHOLESTEROL CAL: 35 mg/dL (ref 5–40)

## 2015-08-17 LAB — BASIC METABOLIC PANEL
BUN / CREAT RATIO: 13 (ref 9–23)
BUN: 11 mg/dL (ref 6–24)
CO2: 24 mmol/L (ref 18–29)
CREATININE: 0.82 mg/dL (ref 0.57–1.00)
Calcium: 9.5 mg/dL (ref 8.7–10.2)
Chloride: 96 mmol/L (ref 96–106)
GFR, EST AFRICAN AMERICAN: 98 mL/min/{1.73_m2} (ref >59)
GFR, EST NON AFRICAN AMERICAN: 85 mL/min/{1.73_m2} (ref >59)
GLUCOSE: 81 mg/dL (ref 65–99)
Potassium: 4 mmol/L (ref 3.5–5.2)
SODIUM: 136 mmol/L (ref 134–144)

## 2015-08-17 LAB — HEPATIC FUNCTION PANEL
ALBUMIN: 4.2 g/dL (ref 3.5–5.5)
ALK PHOS: 72 IU/L (ref 39–117)
ALT: 20 IU/L (ref 0–32)
AST: 18 IU/L (ref 0–40)
BILIRUBIN, DIRECT: 0.13 mg/dL (ref 0.00–0.40)
Bilirubin Total: 0.6 mg/dL (ref 0.0–1.2)
Total Protein: 6.6 g/dL (ref 6.0–8.5)

## 2015-08-17 LAB — TSH: TSH: 1.77 u[IU]/mL (ref 0.450–4.500)

## 2015-08-23 ENCOUNTER — Encounter: Payer: Self-pay | Admitting: Family Medicine

## 2015-08-23 ENCOUNTER — Ambulatory Visit (INDEPENDENT_AMBULATORY_CARE_PROVIDER_SITE_OTHER): Payer: BLUE CROSS/BLUE SHIELD | Admitting: Family Medicine

## 2015-08-23 VITALS — BP 128/82 | Ht 64.0 in | Wt 168.8 lb

## 2015-08-23 DIAGNOSIS — E785 Hyperlipidemia, unspecified: Secondary | ICD-10-CM | POA: Diagnosis not present

## 2015-08-23 DIAGNOSIS — E039 Hypothyroidism, unspecified: Secondary | ICD-10-CM | POA: Diagnosis not present

## 2015-08-23 MED ORDER — PRAVASTATIN SODIUM 40 MG PO TABS: 40.0000 mg | ORAL_TABLET | Freq: Every day | ORAL | 1 refills | Status: DC

## 2015-08-23 NOTE — Progress Notes (Signed)
   Subjective:    Patient ID: Carol Estrada, female    DOB: 09-07-1965, 50 y.o.   MRN: [id number]  Hypertension  This is a chronic problem. The current episode started more than 1 year ago. Risk factors for coronary artery disease include dyslipidemia. Treatments tried: triam/hctz. There are no compliance problems.    Results for orders placed or performed in visit on 07/05/15  Lipid panel  Result Value Ref Range   Cholesterol, Total 233 (H) 100 - 199 mg/dL   Triglycerides 174 (H) 0 - 149 mg/dL   HDL 53 >39 mg/dL   VLDL Cholesterol Cal 35 5 - 40 mg/dL   LDL Calculated 145 (H) 0 - 99 mg/dL   Chol/HDL Ratio 4.4 0.0 - 4.4 ratio units  Hepatic function panel  Result Value Ref Range   Total Protein 6.6 6.0 - 8.5 g/dL   Albumin 4.2 3.5 - 5.5 g/dL   Bilirubin Total 0.6 0.0 - 1.2 mg/dL   Bilirubin, Direct 0.13 0.00 - 0.40 mg/dL   Alkaline Phosphatase 72 39 - 117 IU/L   AST 18 0 - 40 IU/L   ALT 20 0 - 32 IU/L  TSH  Result Value Ref Range   TSH 1.770 0.450 - 4.500 uIU/mL  Basic metabolic panel  Result Value Ref Range   Glucose 81 65 - 99 mg/dL   BUN 11 6 - 24 mg/dL   Creatinine, Ser 0.82 0.57 - 1.00 mg/dL   GFR calc non Af Amer 85 >59 mL/min/1.73   GFR calc Af Amer 98 >59 mL/min/1.73   BUN/Creatinine Ratio 13 9 - 23   Sodium 136 134 - 144 mmol/L   Potassium 4.0 3.5 - 5.2 mmol/L   Chloride 96 96 - 106 mmol/L   CO2 24 18 - 29 mmol/L   Calcium 9.5 8.7 - 10.2 mg/dL    Staying active but not reg exercise  Patient continues to take lipid medication regularly. No obvious side effects from it. Generally does not miss a dose. Prior blood work results are reviewed with patient. Patient continues to work on fat intake in diet   Review of Systems No headache, no major weight loss or weight gain, no chest pain no back pain abdominal pain no change in bowel habits complete ROS otherwise negative     Objective:   Physical Exam Alert vitals stable, NAD. Blood pressure good on repeat.  HEENT normal. Lungs clear. Heart regular rate and rhythm.        Assessment & Plan:  Impression plan #1 hypothyroidism excellent control compliance discussed to maintain same therapy #2 hyperlipidemia suboptimal control discussed to increase Pravachol to 40 diet exercise discussed WSL

## 2015-08-29 ENCOUNTER — Other Ambulatory Visit: Payer: Self-pay | Admitting: *Deleted

## 2015-08-29 MED ORDER — NORETHINDRONE-ETH ESTRADIOL 1-35 MG-MCG PO TABS: 1.0000 | ORAL_TABLET | Freq: Every day | ORAL | 0 refills | Status: DC

## 2015-08-29 NOTE — Telephone Encounter (Signed)
Annual scheduled in September

## 2015-10-05 ENCOUNTER — Encounter: Payer: BLUE CROSS/BLUE SHIELD | Admitting: Gynecology

## 2015-10-12 ENCOUNTER — Ambulatory Visit (INDEPENDENT_AMBULATORY_CARE_PROVIDER_SITE_OTHER): Payer: BLUE CROSS/BLUE SHIELD | Admitting: Gynecology

## 2015-10-12 ENCOUNTER — Encounter: Payer: Self-pay | Admitting: Gynecology

## 2015-10-12 VITALS — BP 120/76 | Ht 64.0 in | Wt 167.0 lb

## 2015-10-12 DIAGNOSIS — Z01419 Encounter for gynecological examination (general) (routine) without abnormal findings: Secondary | ICD-10-CM | POA: Diagnosis not present

## 2015-10-12 MED ORDER — NORETHINDRONE-ETH ESTRADIOL 1-35 MG-MCG PO TABS: 1.0000 | ORAL_TABLET | Freq: Every day | ORAL | 4 refills | Status: DC

## 2015-10-12 MED ORDER — TRIAMTERENE-HCTZ 37.5-25 MG PO TABS: 1.0000 | ORAL_TABLET | Freq: Every day | ORAL | 0 refills | Status: DC

## 2015-10-12 NOTE — Progress Notes (Signed)
    Carol Estrada 1965/02/06 [id number]        50 y.o.  G1P1001  for annual exam.  Doing well without complaints  Past medical history,surgical history, problem list, medications, allergies, family history and social history were all reviewed and documented as reviewed in the EPIC chart.  ROS:  Performed with pertinent positives and negatives included in the history, assessment and plan.   Additional significant findings :  None   Exam: Carol Estrada assistant Vitals:   10/12/15 1001  BP: 120/76  Weight: 167 lb (75.8 kg)  Height: 5\' 4"  (1.626 m)   Body mass index is 28.67 kg/m.  General appearance:  Normal affect, orientation and appearance. Skin: Grossly normal excepting small raised fibrotic nodule anterior left thigh HEENT: Without gross lesions.  No cervical or supraclavicular adenopathy. Thyroid normal.  Lungs:  Clear without wheezing, rales or rhonchi Cardiac: RR, without RMG Abdominal:  Soft, nontender, without masses, guarding, rebound, organomegaly or hernia Breasts:  Examined lying and sitting without masses, retractions, discharge or axillary adenopathy. Pelvic:  Ext/BUS/Vagina normal  Cervix normal  Uterus anteverted, normal size, shape and contour, midline and mobile nontender   Adnexa without masses or tenderness    Anus and perineum normal   Rectovaginal normal sphincter tone without palpated masses or tenderness.    Assessment/Plan:  50 y.o. G69P1001 female for annual exam with regular menses, oral contraceptives.   1. Oral contraceptives. Continues on Ortho-Novum 1/35 equivalents. Doing well and wants to continue. Reviewed possible increased risks of thrombosis particularly with increasing age. Patient understands and accepts. Refill 1 year provided. 2. Mammography due now and I reminded patient to schedule. SBE monthly reviewed. 3. Pap smear 2016. No Pap smear done today. No history of significant abnormal Pap smears. 4. Uses Dyazide once weekly for  swelling. Has done so for years. #90 no refill provided. 5. Uses acyclovir for occasional HSV labialis outbreak. Has supply will call when needs more. 6. Biopsy-proven buttocks lichen sclerosus. Exam is normal. Has used Temovate but does not need prescription now. 7. Small fibrotic nodule anterior left thigh. Patient has appointment with dermatologist and will follow up with them for evaluation and possible excision. 8. Health maintenance. Has routine lab work done at her primary physician's office. Follow up in one year, sooner as needed.   Carol Auerbach MD, 10:24 AM 10/12/2015

## 2015-10-12 NOTE — Patient Instructions (Addendum)
Scheduling your mammogram.  You may obtain a copy of any labs that were done today by logging onto MyChart as outlined in the instructions provided with your AVS (after visit summary). The office will not call with normal lab results but certainly if there are any significant abnormalities then we will contact you.   Health Maintenance Adopting a healthy lifestyle and getting preventive care can go a long way to promote health and wellness. Talk with your health care provider about what schedule of regular examinations is right for you. This is a good chance for you to check in with your provider about disease prevention and staying healthy. In between checkups, there are plenty of things you can do on your own. Experts have done a lot of research about which lifestyle changes and preventive measures are most likely to keep you healthy. Ask your health care provider for more information. WEIGHT AND DIET  Eat a healthy diet  Be sure to include plenty of vegetables, fruits, low-fat dairy products, and lean protein.  Do not eat a lot of foods high in solid fats, added sugars, or salt.  Get regular exercise. This is one of the most important things you can do for your health.  Most adults should exercise for at least 150 minutes each week. The exercise should increase your heart rate and make you sweat (moderate-intensity exercise).  Most adults should also do strengthening exercises at least twice a week. This is in addition to the moderate-intensity exercise.  Maintain a healthy weight  Body mass index (BMI) is a measurement that can be used to identify possible weight problems. It estimates body fat based on height and weight. Your health care provider can help determine your BMI and help you achieve or maintain a healthy weight.  For females 20 years of age and older:   A BMI below 18.5 is considered underweight.  A BMI of 18.5 to 24.9 is normal.  A BMI of 25 to 29.9 is considered  overweight.  A BMI of 30 and above is considered obese.  Watch levels of cholesterol and blood lipids  You should start having your blood tested for lipids and cholesterol at 48 years of age, then have this test every 5 years.  You may need to have your cholesterol levels checked more often if:  Your lipid or cholesterol levels are high.  You are older than 48 years of age.  You are at high risk for heart disease.  CANCER SCREENING   Lung Cancer  Lung cancer screening is recommended for adults 55-80 years old who are at high risk for lung cancer because of a history of smoking.  A yearly low-dose CT scan of the lungs is recommended for people who:  Currently smoke.  Have quit within the past 15 years.  Have at least a 30-pack-year history of smoking. A pack year is smoking an average of one pack of cigarettes a day for 1 year.  Yearly screening should continue until it has been 15 years since you quit.  Yearly screening should stop if you develop a health problem that would prevent you from having lung cancer treatment.  Breast Cancer  Practice breast self-awareness. This means understanding how your breasts normally appear and feel.  It also means doing regular breast self-exams. Let your health care provider know about any changes, no matter how small.  If you are in your 20s or 30s, you should have a clinical breast exam (CBE) by a health   health care provider every 1-3 years as part of a regular health exam.  If you are 26 or older, have a CBE every year. Also consider having a breast X-ray (mammogram) every year.  If you have a family history of breast cancer, talk to your health care provider about genetic screening.  If you are at high risk for breast cancer, talk to your health care provider about having an MRI and a mammogram every year.  Breast cancer gene (BRCA) assessment is recommended for women who have family members with BRCA-related cancers. BRCA-related  cancers include:  Breast.  Ovarian.  Tubal.  Peritoneal cancers.  Results of the assessment will determine the need for genetic counseling and BRCA1 and BRCA2 testing. Cervical Cancer Routine pelvic examinations to screen for cervical cancer are no longer recommended for nonpregnant women who are considered low risk for cancer of the pelvic organs (ovaries, uterus, and vagina) and who do not have symptoms. A pelvic examination may be necessary if you have symptoms including those associated with pelvic infections. Ask your health care provider if a screening pelvic exam is right for you.   The Pap test is the screening test for cervical cancer for women who are considered at risk.  If you had a hysterectomy for a problem that was not cancer or a condition that could lead to cancer, then you no longer need Pap tests.  If you are older than 65 years, and you have had normal Pap tests for the past 10 years, you no longer need to have Pap tests.  If you have had past treatment for cervical cancer or a condition that could lead to cancer, you need Pap tests and screening for cancer for at least 20 years after your treatment.  If you no longer get a Pap test, assess your risk factors if they change (such as having a new sexual partner). This can affect whether you should start being screened again.  Some women have medical problems that increase their chance of getting cervical cancer. If this is the case for you, your health care provider may recommend more frequent screening and Pap tests.  The human papillomavirus (HPV) test is another test that may be used for cervical cancer screening. The HPV test looks for the virus that can cause cell changes in the cervix. The cells collected during the Pap test can be tested for HPV.  The HPV test can be used to screen women 22 years of age and older. Getting tested for HPV can extend the interval between normal Pap tests from three to five  years.  An HPV test also should be used to screen women of any age who have unclear Pap test results.  After 48 years of age, women should have HPV testing as often as Pap tests.  Colorectal Cancer  This type of cancer can be detected and often prevented.  Routine colorectal cancer screening usually begins at 48 years of age and continues through 48 years of age.  Your health care provider may recommend screening at an earlier age if you have risk factors for colon cancer.  Your health care provider may also recommend using home test kits to check for hidden blood in the stool.  A small camera at the end of a tube can be used to examine your colon directly (sigmoidoscopy or colonoscopy). This is done to check for the earliest forms of colorectal cancer.  Routine screening usually begins at age 60.  Direct examination of  colon should be repeated every 5-10 years through 48 years of age. However, you may need to be screened more often if early forms of precancerous polyps or small growths are found. Skin Cancer  Check your skin from head to toe regularly.  Tell your health care provider about any new moles or changes in moles, especially if there is a change in a mole's shape or color.  Also tell your health care provider if you have a mole that is larger than the size of a pencil eraser.  Always use sunscreen. Apply sunscreen liberally and repeatedly throughout the day.  Protect yourself by wearing long sleeves, pants, a wide-brimmed hat, and sunglasses whenever you are outside. HEART DISEASE, DIABETES, AND HIGH BLOOD PRESSURE   Have your blood pressure checked at least every 1-2 years. High blood pressure causes heart disease and increases the risk of stroke.  If you are between 55 years and 79 years old, ask your health care provider if you should take aspirin to prevent strokes.  Have regular diabetes screenings. This involves taking a blood sample to check your fasting  blood sugar level.  If you are at a normal weight and have a low risk for diabetes, have this test once every three years after 48 years of age.  If you are overweight and have a high risk for diabetes, consider being tested at a younger age or more often. PREVENTING INFECTION  Hepatitis B  If you have a higher risk for hepatitis B, you should be screened for this virus. You are considered at high risk for hepatitis B if:  You were born in a country where hepatitis B is common. Ask your health care provider which countries are considered high risk.  Your parents were born in a high-risk country, and you have not been immunized against hepatitis B (hepatitis B vaccine).  You have HIV or AIDS.  You use needles to inject street drugs.  You live with someone who has hepatitis B.  You have had sex with someone who has hepatitis B.  You get hemodialysis treatment.  You take certain medicines for conditions, including cancer, organ transplantation, and autoimmune conditions. Hepatitis C  Blood testing is recommended for:  Everyone born from 1945 through 1965.  Anyone with known risk factors for hepatitis C. Sexually transmitted infections (STIs)  You should be screened for sexually transmitted infections (STIs) including gonorrhea and chlamydia if:  You are sexually active and are younger than 48 years of age.  You are older than 48 years of age and your health care provider tells you that you are at risk for this type of infection.  Your sexual activity has changed since you were last screened and you are at an increased risk for chlamydia or gonorrhea. Ask your health care provider if you are at risk.  If you do not have HIV, but are at risk, it may be recommended that you take a prescription medicine daily to prevent HIV infection. This is called pre-exposure prophylaxis (PrEP). You are considered at risk if:  You are sexually active and do not regularly use condoms or know  the HIV status of your partner(s).  You take drugs by injection.  You are sexually active with a partner who has HIV. Talk with your health care provider about whether you are at high risk of being infected with HIV. If you choose to begin PrEP, you should first be tested for HIV. You should then be tested every 3 months   for as long as you are taking PrEP.  PREGNANCY   If you are premenopausal and you may become pregnant, ask your health care provider about preconception counseling.  If you may become pregnant, take 400 to 800 micrograms (mcg) of folic acid every day.  If you want to prevent pregnancy, talk to your health care provider about birth control (contraception). OSTEOPOROSIS AND MENOPAUSE   Osteoporosis is a disease in which the bones lose minerals and strength with aging. This can result in serious bone fractures. Your risk for osteoporosis can be identified using a bone density scan.  If you are 65 years of age or older, or if you are at risk for osteoporosis and fractures, ask your health care provider if you should be screened.  Ask your health care provider whether you should take a calcium or vitamin D supplement to lower your risk for osteoporosis.  Menopause may have certain physical symptoms and risks.  Hormone replacement therapy may reduce some of these symptoms and risks. Talk to your health care provider about whether hormone replacement therapy is right for you.  HOME CARE INSTRUCTIONS   Schedule regular health, dental, and eye exams.  Stay current with your immunizations.   Do not use any tobacco products including cigarettes, chewing tobacco, or electronic cigarettes.  If you are pregnant, do not drink alcohol.  If you are breastfeeding, limit how much and how often you drink alcohol.  Limit alcohol intake to no more than 1 drink per day for nonpregnant women. One drink equals 12 ounces of beer, 5 ounces of wine, or 1 ounces of hard liquor.  Do not  use street drugs.  Do not share needles.  Ask your health care provider for help if you need support or information about quitting drugs.  Tell your health care provider if you often feel depressed.  Tell your health care provider if you have ever been abused or do not feel safe at home. Document Released: 07/31/2010 Document Revised: 06/01/2013 Document Reviewed: 12/17/2012 ExitCare Patient Information 2015 [employer]. This information is not intended to replace advice given to you by your health care provider. Make sure you discuss any questions you have with your health care provider.  

## 2015-10-31 ENCOUNTER — Other Ambulatory Visit: Payer: Self-pay | Admitting: Dermatology

## 2015-11-28 ENCOUNTER — Other Ambulatory Visit: Payer: Self-pay | Admitting: Gynecology

## 2015-11-28 DIAGNOSIS — Z1231 Encounter for screening mammogram for malignant neoplasm of breast: Secondary | ICD-10-CM

## 2016-01-03 ENCOUNTER — Ambulatory Visit
Admission: RE | Admit: 2016-01-03 | Discharge: 2016-01-03 | Disposition: A | Discharge status: Home / Self Care | Payer: 59 | Source: Ambulatory Visit | Attending: Gynecology | Admitting: Gynecology

## 2016-01-03 DIAGNOSIS — Z1231 Encounter for screening mammogram for malignant neoplasm of breast: Secondary | ICD-10-CM

## 2016-02-17 ENCOUNTER — Encounter: Payer: Self-pay | Admitting: Family Medicine

## 2016-02-17 DIAGNOSIS — E785 Hyperlipidemia, unspecified: Secondary | ICD-10-CM

## 2016-02-17 DIAGNOSIS — E079 Disorder of thyroid, unspecified: Secondary | ICD-10-CM

## 2016-02-21 ENCOUNTER — Encounter: Payer: Self-pay | Admitting: Family Medicine

## 2016-02-21 ENCOUNTER — Ambulatory Visit (INDEPENDENT_AMBULATORY_CARE_PROVIDER_SITE_OTHER): Payer: 59 | Admitting: Family Medicine

## 2016-02-21 VITALS — BP 120/82 | Ht 64.0 in | Wt 166.0 lb

## 2016-02-21 DIAGNOSIS — E079 Disorder of thyroid, unspecified: Secondary | ICD-10-CM

## 2016-02-21 DIAGNOSIS — E785 Hyperlipidemia, unspecified: Secondary | ICD-10-CM

## 2016-02-21 LAB — HEPATIC FUNCTION PANEL
ALK PHOS: 66 IU/L (ref 39–117)
ALT: 14 IU/L (ref 0–32)
AST: 17 IU/L (ref 0–40)
Albumin: 4 g/dL (ref 3.5–5.5)
BILIRUBIN, DIRECT: 0.07 mg/dL (ref 0.00–0.40)
Bilirubin Total: 0.5 mg/dL (ref 0.0–1.2)
Total Protein: 7 g/dL (ref 6.0–8.5)

## 2016-02-21 LAB — LIPID PANEL
Chol/HDL Ratio: 3.9 ratio units (ref 0.0–4.4)
Cholesterol, Total: 204 mg/dL — ABNORMAL HIGH (ref 100–199)
HDL: 52 mg/dL (ref >39)
LDL Calculated: 121 mg/dL — ABNORMAL HIGH (ref 0–99)
TRIGLYCERIDES: 157 mg/dL — AB (ref 0–149)
VLDL Cholesterol Cal: 31 mg/dL (ref 5–40)

## 2016-02-21 LAB — TSH: TSH: 2.47 u[IU]/mL (ref 0.450–4.500)

## 2016-02-21 MED ORDER — LEVOTHYROXINE SODIUM 88 MCG PO TABS: 88.0000 ug | ORAL_TABLET | Freq: Every day | ORAL | 1 refills | Status: DC

## 2016-02-21 MED ORDER — PRAVASTATIN SODIUM 40 MG PO TABS: 40.0000 mg | ORAL_TABLET | Freq: Every day | ORAL | 1 refills | Status: DC

## 2016-02-21 NOTE — Progress Notes (Signed)
   Subjective:    Patient ID: Carol Estrada, female    DOB: 1966-01-04, 51 y.o.   MRN: [id number]  Hyperlipidemia  This is a chronic problem. The current episode started more than 1 year ago. There are no known factors aggravating her hyperlipidemia. Current antihyperlipidemic treatment includes statins. The current treatment provides moderate improvement of lipids. There are no compliance problems.  There are no known risk factors for coronary artery disease.   Bloodwork is in the system.    Results for orders placed or performed in visit on 02/17/16  Lipid Profile  Result Value Ref Range   Cholesterol, Total 204 (H) 100 - 199 mg/dL   Triglycerides 157 (H) 0 - 149 mg/dL   HDL 52 >39 mg/dL   VLDL Cholesterol Cal 31 5 - 40 mg/dL   LDL Calculated 121 (H) 0 - 99 mg/dL   Chol/HDL Ratio 3.9 0.0 - 4.4 ratio units  Hepatic function panel  Result Value Ref Range   Total Protein 7.0 6.0 - 8.5 g/dL   Albumin 4.0 3.5 - 5.5 g/dL   Bilirubin Total 0.5 0.0 - 1.2 mg/dL   Bilirubin, Direct 0.07 0.00 - 0.40 mg/dL   Alkaline Phosphatase 66 39 - 117 IU/L   AST 17 0 - 40 IU/L   ALT 14 0 - 32 IU/L  TSH  Result Value Ref Range   TSH 2.470 0.450 - 4.500 uIU/mL   Couple times per week ewalking  Busy to busy to exercise per pt  Compliant with meds, does not miss a dose,, Patient has no concerns at this time.    Review of Systems No headache, no major weight loss or weight gain, no chest pain no back pain abdominal pain no change in bowel habits complete ROS otherwise negative     Objective:   Physical Exam  Alert vitals stable, NAD. Blood pressure good on repeat. HEENT normal. Lungs clear. Heart regular rate and rhythm.       Assessment & Plan:  Impression 1 hyperlipidemia good control discussed maintain same meds #2 hypothyroidism good control discussed maintain same plan meds refilled diet exercise discussed recheck in 6 months blood work t then

## 2016-04-24 ENCOUNTER — Ambulatory Visit (INDEPENDENT_AMBULATORY_CARE_PROVIDER_SITE_OTHER): Payer: 59 | Admitting: Family Medicine

## 2016-04-24 ENCOUNTER — Encounter: Payer: Self-pay | Admitting: Family Medicine

## 2016-04-24 VITALS — BP 120/76 | Temp 98.0°F | Ht 64.0 in | Wt 166.5 lb

## 2016-04-24 DIAGNOSIS — J111 Influenza due to unidentified influenza virus with other respiratory manifestations: Secondary | ICD-10-CM

## 2016-04-24 MED ORDER — OSELTAMIVIR PHOSPHATE 75 MG PO CAPS: 75.0000 mg | ORAL_CAPSULE | Freq: Two times a day (BID) | ORAL | 0 refills | Status: AC

## 2016-04-24 NOTE — Progress Notes (Signed)
   Subjective:    Patient ID: Carol Estrada, female    DOB: 28-Apr-1965, 51 y.o.   MRN: [id number]  Fever   This is a new problem. The current episode started yesterday. Associated symptoms include headaches and muscle aches. She has tried NSAIDs and acetaminophen for the symptoms.   Yesterday after lunch, felt very achey  Painful sand papery throat discomfort  Sig headache , tyl and ibu  Patient states no other concerns this visit.  Felt warm an chills, nore achey than not     Pretty rough   No energy   No appetite    Review of Systems  Constitutional: Positive for fever.  Neurological: Positive for headaches.       Objective:   Physical Exam  Alert vitals reviewed, moderate malaise. Hydration good. Positive nasal congestion lungs no crackles or wheezes, no tachypnea, intermittent bronchial cough during exam heart regular rate and rhythm.       Assessment & Plan:  Impression influenza discussed at length. Petra Kuba of illness and potential sequela discussed. Plan Tamiflu prescribed if indicated and timing appropriate. Symptom care discussed. Warning signs discussed. WSL

## 2016-07-09 ENCOUNTER — Telehealth: Payer: Self-pay | Admitting: Family Medicine

## 2016-07-09 ENCOUNTER — Encounter: Payer: Self-pay | Admitting: Family Medicine

## 2016-07-09 ENCOUNTER — Other Ambulatory Visit: Payer: Self-pay | Admitting: *Deleted

## 2016-07-09 MED ORDER — DOXYCYCLINE HYCLATE 100 MG PO TABS: 100.0000 mg | ORAL_TABLET | Freq: Two times a day (BID) | ORAL | 0 refills | Status: DC

## 2016-07-09 MED ORDER — MUPIROCIN 2 % EX OINT: 1.0000 "application " | TOPICAL_OINTMENT | Freq: Two times a day (BID) | CUTANEOUS | 0 refills | Status: DC

## 2016-07-09 NOTE — Telephone Encounter (Signed)
Let pt knmow I reviewed images and my rec fremain the same, most ideal treatment for this is face to face eval by clinician.

## 2016-07-09 NOTE — Telephone Encounter (Signed)
On further thought rec the doxy and the [place]

## 2016-07-09 NOTE — Telephone Encounter (Signed)
Patient called and said she sent a picture through [facility].

## 2016-07-09 NOTE — Telephone Encounter (Signed)
Discussed with pt. Pt does want to get med and not go to urgent care. meds sent to pharm. Pt advised to go to urgent care or ED if worse. Not having any fevers.

## 2016-07-09 NOTE — Telephone Encounter (Signed)
Please see pictures

## 2016-07-09 NOTE — Telephone Encounter (Signed)
Spoke with patient and patient stated that she seen Urgent care for bite on Saturday. Patient states she was prescribed Triamcinolone cream. After applying cream she said that bite started burning so she wiped it off with rubbing alcohol and now bite is red, swollen around site and blistering. Patient stated that she is taking benadryl and ibuprofen. Patient is currently out of town and can not make it into the office.Denies fevers, muscle aches, and SOB. Please advise?

## 2016-07-09 NOTE — Telephone Encounter (Signed)
Patient has a possible bite on her leg that she seen Dr. Nevada Crane for over the weekend at his urgent care.  It was circle with a bullseye behind her knee.  She was given a steroid cream  (triamcinole cream) which has now caused her to have blisters and redness. She thinks she is allergic to it.  She is unable to come in the office because she is out of town. She is requesting a nurse to call her back

## 2016-07-09 NOTE — Telephone Encounter (Signed)
Left message return call 07/09/2016

## 2016-07-09 NOTE — Telephone Encounter (Signed)
No great answer, extremely hard to manage rashe at a distance, particully when they are failing to respond to meds prescribed elsewhere. My preferenced is for her to to a provider down there, but she likely does nto wnt to do that. If the rsh is tender, the most important thing to do is treat possibility of infection so i'd stop triamcin, nd give doxy 100 bid ten d (sun sensitizer so b careful) and bactroban oint bid. If not tendr would stop the triamcin and simply rx the bactroban,  Best answer is go seek med hel there

## 2016-07-11 ENCOUNTER — Encounter: Payer: Self-pay | Admitting: Family Medicine

## 2016-07-11 ENCOUNTER — Telehealth: Payer: Self-pay | Admitting: Family Medicine

## 2016-07-11 NOTE — Telephone Encounter (Signed)
Patient wanted to let Dr. Richardson Landry know that she sent more pictures through MyChart for him to view on her knee.  She said she feels fine, no fever.  They have a minute clinic at the pharmacy she is using there and she said the NP told her to come back today if no improvement.

## 2016-07-11 NOTE — Telephone Encounter (Signed)
Spoke with patient and informed her per Dr.Steve Luking- Dr.Steve recommends that the rash be assessed in person versus via pictures on mychart. Patient verbalized understanding and stated she will see a provider at the [facility].

## 2016-07-11 NOTE — Telephone Encounter (Signed)
See my chart response .

## 2016-07-16 ENCOUNTER — Encounter: Payer: Self-pay | Admitting: Family Medicine

## 2016-07-16 ENCOUNTER — Ambulatory Visit (INDEPENDENT_AMBULATORY_CARE_PROVIDER_SITE_OTHER): Payer: 59 | Admitting: Family Medicine

## 2016-07-16 VITALS — BP 120/80 | Ht 64.0 in | Wt 167.1 lb

## 2016-07-16 DIAGNOSIS — L03115 Cellulitis of right lower limb: Secondary | ICD-10-CM

## 2016-07-16 NOTE — Progress Notes (Signed)
   Subjective:    Patient ID: Carol Estrada, female    DOB: 10-22-1965, 51 y.o.   MRN: [id number]  HPI Patient in today for possible follow up on spider bite behind right leg. Patient states bite is getting better.   Pt had last wk a possible bite  By Friday red and it hy  By sat abulls eye  Went to urgicare on sat  rx ed steroid cream  Then blw upa nd blistered  On Sunday, was given abx  By wed had developed a substantial  extesive halo enlarging  Went to mini clinic  Down a the [facility] ot a mid level provider at the [facility] a culture of the wound  Seen then  swotched tp  A bactrim ds at that time, had a blister opened up, by Thursday sweling and discomfort was getting vbettter,  By the weekend    States no other concerns this visit.   Review of Systems No headache, no major weight loss or weight gain, no chest pain no back pain abdominal pain no change in bowel habits complete ROS otherwise negative     Objective:   Physical Exam Alert vitals stable, NAD. Blood pressure good on repeat. HEENT normal. Lungs clear. Heart regular rate and rhythm. Right leg residual cellulitis minimal tenderness. Positive postinflammatory changes. Central healing blister       Assessment & Plan:  Impression initial bite with secondary cellulitis resistant to [place] plan await cultures. On Bactrim expect very slow resolution warning signs discussed

## 2016-08-08 ENCOUNTER — Telehealth: Payer: Self-pay | Admitting: Family Medicine

## 2016-08-08 DIAGNOSIS — R5383 Other fatigue: Secondary | ICD-10-CM

## 2016-08-08 DIAGNOSIS — E079 Disorder of thyroid, unspecified: Secondary | ICD-10-CM

## 2016-08-08 DIAGNOSIS — E785 Hyperlipidemia, unspecified: Secondary | ICD-10-CM

## 2016-08-08 DIAGNOSIS — Z79899 Other long term (current) drug therapy: Secondary | ICD-10-CM

## 2016-08-08 NOTE — Telephone Encounter (Signed)
Lip liv m7 tsh 

## 2016-08-08 NOTE — Telephone Encounter (Signed)
Last blood work 02/20/16- TSH, LIVER and LIPID

## 2016-08-08 NOTE — Telephone Encounter (Signed)
Last labs 02/20/16 lipid, liver, tsh

## 2016-08-08 NOTE — Telephone Encounter (Signed)
Patient is requesting labs before her appointment on 8/6 for 6 month follow up.

## 2016-08-09 NOTE — Telephone Encounter (Signed)
I called and left a voice message, asked that she r/c.

## 2016-08-09 NOTE — Telephone Encounter (Signed)
Patient is aware and she will go for the appointment on 08/10/2016. Orders sent to [facility].

## 2016-08-18 LAB — HEPATIC FUNCTION PANEL
ALBUMIN: 4.1 g/dL (ref 3.5–5.5)
ALT: 13 IU/L (ref 0–32)
AST: 17 IU/L (ref 0–40)
Alkaline Phosphatase: 62 IU/L (ref 39–117)
Bilirubin Total: 0.3 mg/dL (ref 0.0–1.2)
Bilirubin, Direct: 0.07 mg/dL (ref 0.00–0.40)
Total Protein: 6.7 g/dL (ref 6.0–8.5)

## 2016-08-18 LAB — BASIC METABOLIC PANEL
BUN / CREAT RATIO: 11 (ref 9–23)
BUN: 9 mg/dL (ref 6–24)
CO2: 20 mmol/L (ref 20–29)
Calcium: 9.2 mg/dL (ref 8.7–10.2)
Chloride: 105 mmol/L (ref 96–106)
Creatinine, Ser: 0.82 mg/dL (ref 0.57–1.00)
GFR, EST AFRICAN AMERICAN: 97 mL/min/{1.73_m2} (ref >59)
GFR, EST NON AFRICAN AMERICAN: 84 mL/min/{1.73_m2} (ref >59)
Glucose: 79 mg/dL (ref 65–99)
POTASSIUM: 4.2 mmol/L (ref 3.5–5.2)
SODIUM: 142 mmol/L (ref 134–144)

## 2016-08-18 LAB — LIPID PANEL
CHOLESTEROL TOTAL: 210 mg/dL — AB (ref 100–199)
Chol/HDL Ratio: 3.8 ratio (ref 0.0–4.4)
HDL: 56 mg/dL (ref >39)
LDL Calculated: 129 mg/dL — ABNORMAL HIGH (ref 0–99)
TRIGLYCERIDES: 126 mg/dL (ref 0–149)
VLDL Cholesterol Cal: 25 mg/dL (ref 5–40)

## 2016-08-18 LAB — TSH: TSH: 1.74 u[IU]/mL (ref 0.450–4.500)

## 2016-08-27 ENCOUNTER — Other Ambulatory Visit: Payer: Self-pay | Admitting: Family Medicine

## 2016-08-27 ENCOUNTER — Ambulatory Visit: Payer: 59 | Admitting: Family Medicine

## 2016-09-03 ENCOUNTER — Ambulatory Visit (INDEPENDENT_AMBULATORY_CARE_PROVIDER_SITE_OTHER): Payer: 59 | Admitting: Family Medicine

## 2016-09-03 ENCOUNTER — Encounter: Payer: Self-pay | Admitting: Family Medicine

## 2016-09-03 VITALS — BP 110/84 | Ht 64.0 in | Wt 166.0 lb

## 2016-09-03 DIAGNOSIS — E079 Disorder of thyroid, unspecified: Secondary | ICD-10-CM

## 2016-09-03 DIAGNOSIS — E785 Hyperlipidemia, unspecified: Secondary | ICD-10-CM | POA: Diagnosis not present

## 2016-09-03 NOTE — Progress Notes (Signed)
   Subjective:    Patient ID: Carol Estrada, female    DOB: 05/08/1965, 51 y.o.   MRN: [id number]  Hyperlipidemia  This is a chronic problem. The problem is controlled.  Patient states she eats healthy and she is active.  Results for orders placed or performed in visit on 51/70/01  Basic metabolic panel  Result Value Ref Range   Glucose 79 65 - 99 mg/dL   BUN 9 6 - 24 mg/dL   Creatinine, Ser 0.82 0.57 - 1.00 mg/dL   GFR calc non Af Amer 84 >59 mL/min/1.73   GFR calc Af Amer 97 >59 mL/min/1.73   BUN/Creatinine Ratio 11 9 - 23   Sodium 142 134 - 144 mmol/L   Potassium 4.2 3.5 - 5.2 mmol/L   Chloride 105 96 - 106 mmol/L   CO2 20 20 - 29 mmol/L   Calcium 9.2 8.7 - 10.2 mg/dL  Hepatic function panel  Result Value Ref Range   Total Protein 6.7 6.0 - 8.5 g/dL   Albumin 4.1 3.5 - 5.5 g/dL   Bilirubin Total 0.3 0.0 - 1.2 mg/dL   Bilirubin, Direct 0.07 0.00 - 0.40 mg/dL   Alkaline Phosphatase 62 39 - 117 IU/L   AST 17 0 - 40 IU/L   ALT 13 0 - 32 IU/L  TSH  Result Value Ref Range   TSH 1.740 0.450 - 4.500 uIU/mL  Lipid panel  Result Value Ref Range   Cholesterol, Total 210 (H) 100 - 199 mg/dL   Triglycerides 126 0 - 149 mg/dL   HDL 56 >39 mg/dL   VLDL Cholesterol Cal 25 5 - 40 mg/dL   LDL Calculated 129 (H) 0 - 99 mg/dL   Chol/HDL Ratio 3.8 0.0 - 4.4 ratio   Lots of walking at least three times per wk  Patient continues to take lipid medication regularly. No obvious side effects from it. Generally does not miss a dose. Prior blood work results are reviewed with patient. Patient continues to work on fat intake in diet   Cont to monitor diet and watching it closely   Sticks with  No other concerns.  Review of Systems No headache, no major weight loss or weight gain, no chest pain no back pain abdominal pain no change in bowel habits complete ROS otherwise negative     Objective:   Physical Exam  Alert vitals stable, NAD. Blood pressure good on repeat. HEENT normal.  Lungs clear. Heart regular rate and rhythm.       Assessment & Plan:  Impression 1 hyperlipidemia good control. Not perfect. Discussed. Patient to work harder on her diet #2 hypothyroidism good control discussed maintain same dose. Diet exercise discussed

## 2016-09-04 ENCOUNTER — Other Ambulatory Visit: Payer: Self-pay | Admitting: Family Medicine

## 2016-09-06 ENCOUNTER — Other Ambulatory Visit: Payer: Self-pay | Admitting: *Deleted

## 2016-09-13 ENCOUNTER — Other Ambulatory Visit: Payer: Self-pay | Admitting: Family Medicine

## 2016-09-17 ENCOUNTER — Other Ambulatory Visit: Payer: Self-pay | Admitting: Family Medicine

## 2016-09-17 NOTE — Telephone Encounter (Signed)
Last seen 09/03/16

## 2016-10-18 ENCOUNTER — Other Ambulatory Visit: Payer: Self-pay

## 2016-10-18 ENCOUNTER — Encounter: Payer: Self-pay | Admitting: Gynecology

## 2016-10-18 ENCOUNTER — Ambulatory Visit (INDEPENDENT_AMBULATORY_CARE_PROVIDER_SITE_OTHER): Payer: 59 | Admitting: Gynecology

## 2016-10-18 VITALS — BP 130/80 | Ht 64.0 in | Wt 167.0 lb

## 2016-10-18 DIAGNOSIS — R609 Edema, unspecified: Secondary | ICD-10-CM | POA: Diagnosis not present

## 2016-10-18 DIAGNOSIS — Z01419 Encounter for gynecological examination (general) (routine) without abnormal findings: Secondary | ICD-10-CM | POA: Diagnosis not present

## 2016-10-18 DIAGNOSIS — L900 Lichen sclerosus et atrophicus: Secondary | ICD-10-CM

## 2016-10-18 DIAGNOSIS — B001 Herpesviral vesicular dermatitis: Secondary | ICD-10-CM | POA: Diagnosis not present

## 2016-10-18 MED ORDER — VALACYCLOVIR HCL 1 G PO TABS: 2000.0000 mg | ORAL_TABLET | Freq: Two times a day (BID) | ORAL | 0 refills | Status: DC

## 2016-10-18 MED ORDER — NORETHINDRONE-ETH ESTRADIOL 1-35 MG-MCG PO TABS: 1.0000 | ORAL_TABLET | Freq: Every day | ORAL | 4 refills | Status: DC

## 2016-10-18 MED ORDER — TRIAMTERENE-HCTZ 37.5-25 MG PO TABS: 1.0000 | ORAL_TABLET | Freq: Every day | ORAL | 0 refills | Status: DC

## 2016-10-18 MED ORDER — ACYCLOVIR 400 MG PO TABS: ORAL_TABLET | ORAL | 0 refills | Status: DC

## 2016-10-18 NOTE — Progress Notes (Signed)
    Carol Estrada October 10, 1965 [id number]        51 y.o.  G1P1001 for annual gynecologic exam.    Past medical history,surgical history, problem list, medications, allergies, family history and social history were all reviewed and documented as reviewed in the EPIC chart.  ROS:  Performed with pertinent positives and negatives included in the history, assessment and plan.   Additional significant findings :  None   Exam: [name] assistant Vitals:   10/18/16 0950  BP: 130/80  Weight: 167 lb (75.8 kg)  Height: 5\' 4"  (1.626 m)   Body mass index is 28.67 kg/m.  General appearance:  Normal affect, orientation and appearance. Skin: Grossly normal HEENT: Without gross lesions.  No cervical or supraclavicular adenopathy. Thyroid normal.  Lungs:  Clear without wheezing, rales or rhonchi Cardiac: RR, without RMG Abdominal:  Soft, nontender, without masses, guarding, rebound, organomegaly or hernia Breasts:  Examined lying and sitting without masses, retractions, discharge or axillary adenopathy. Pelvic:  Ext, BUS, Vagina: Normal  Cervix: Normal  Uterus: Anteverted, normal size, shape and contour, midline and mobile nontender   Adnexa: Without masses or tenderness    Anus and perineum: Normal   Rectovaginal: Normal sphincter tone without palpated masses or tenderness.    Assessment/Plan:  51 y.o. G32P1001 female for annual gynecologic exam with regular menses, oral contraceptives.   1. Oral contraceptives. Continues on Ortho-Novum 1/35 equivalents. Wants to continue. Reviewed risks of thrombosis to include stroke heart attack DVT. Patient not being followed for any medical issues and never smoked. Refill 1 year provided. 2. Pap smear 06/2014. Pap smear done today. No history of abnormal Pap smears previously. 3. Mammography coming due in December and she will schedule this. Breast exam normal today. 4. Dyazide once weekly for swelling and has done so for years. #90 provided with no  refills. 5. Occasional HSV labialis. Valtrex 1 g 2 by mouth every 12 hours 1 day at earliest onset of outbreak #20 provided. 6. Lichen sclerosis, buttocks. Biopsy-proven. Used Temovate with initial outbreak and never had recurrence. Has supply at home but will call if needs more. 7. Health maintenance. No routine lab work done as she does this at her primary physician's office. Follow up 1 year, sooner as needed.   Anastasio Auerbach MD, 10:35 AM 10/18/2016

## 2016-10-18 NOTE — Addendum Note (Signed)
Addended by: Nelva Nay on: 10/18/2016 10:53 AM   Modules accepted: Orders

## 2016-10-18 NOTE — Patient Instructions (Signed)
Follow up in one year for annual exam 

## 2016-10-23 LAB — PAP IG W/ RFLX HPV ASCU

## 2016-11-26 ENCOUNTER — Other Ambulatory Visit: Payer: Self-pay | Admitting: Family Medicine

## 2017-01-18 ENCOUNTER — Ambulatory Visit (INDEPENDENT_AMBULATORY_CARE_PROVIDER_SITE_OTHER): Payer: 59 | Admitting: Family Medicine

## 2017-01-18 VITALS — BP 138/90 | Temp 98.6°F | Ht 64.0 in | Wt 168.0 lb

## 2017-01-18 DIAGNOSIS — J019 Acute sinusitis, unspecified: Secondary | ICD-10-CM

## 2017-01-18 DIAGNOSIS — B9689 Other specified bacterial agents as the cause of diseases classified elsewhere: Secondary | ICD-10-CM | POA: Diagnosis not present

## 2017-01-18 MED ORDER — AZITHROMYCIN 250 MG PO TABS: ORAL_TABLET | ORAL | 0 refills | Status: DC

## 2017-01-18 NOTE — Progress Notes (Signed)
   Subjective:    Patient ID: Carol Estrada, female    DOB: 11-Feb-1965, 51 y.o.   MRN: [id number]  Sinusitis  This is a new problem. Episode onset: 7 days. Associated symptoms include congestion, coughing and headaches. Treatments tried: otc meds.    Felt ran down and coughing and cong  Feeling facial pain and tend   Eats uncomfortable  Dim energy     some cold in the family    no fever  Some headache     facial   Some gunky and vlowing discharge    seven    flonase prn with sinus meds      Review of Systems  HENT: Positive for congestion.   Respiratory: Positive for cough.   Neurological: Positive for headaches.       Objective:   Physical Exam  Alert, mild malaise. Hydration good Vitals stable. frontal/ maxillary tenderness evident positive nasal congestion. pharynx normal neck supple  lungs clear/no crackles or wheezes. heart regular in rhythm       Assessment & Plan:  Impression rhinosinusitis likely post viral, discussed with patient. plan antibiotics prescribed. Questions answered. Symptomatic care discussed. warning signs discussed. WSL

## 2017-02-25 ENCOUNTER — Other Ambulatory Visit: Payer: Self-pay | Admitting: Family Medicine

## 2017-02-27 ENCOUNTER — Telehealth: Payer: Self-pay | Admitting: Family Medicine

## 2017-02-27 DIAGNOSIS — Z79899 Other long term (current) drug therapy: Secondary | ICD-10-CM

## 2017-02-27 DIAGNOSIS — E785 Hyperlipidemia, unspecified: Secondary | ICD-10-CM

## 2017-02-27 DIAGNOSIS — E039 Hypothyroidism, unspecified: Secondary | ICD-10-CM

## 2017-02-27 NOTE — Telephone Encounter (Signed)
Last labs 08/17/16 lipid, live, bmet, tsh

## 2017-02-27 NOTE — Telephone Encounter (Signed)
Blood work ordered in Epic. Patient notified. 

## 2017-02-27 NOTE — Telephone Encounter (Signed)
Lip liv tsh

## 2017-02-27 NOTE — Telephone Encounter (Signed)
Pt has appt 03/12/17 for med check  Needs labs ordered so she can get them done & go over results when seen  Please advise & call pt when orders are ready

## 2017-03-02 LAB — HEPATIC FUNCTION PANEL
ALBUMIN: 4.1 g/dL (ref 3.5–5.5)
ALT: 13 IU/L (ref 0–32)
AST: 17 IU/L (ref 0–40)
Alkaline Phosphatase: 66 IU/L (ref 39–117)
Bilirubin Total: 0.3 mg/dL (ref 0.0–1.2)
Bilirubin, Direct: 0.09 mg/dL (ref 0.00–0.40)
TOTAL PROTEIN: 6.8 g/dL (ref 6.0–8.5)

## 2017-03-02 LAB — TSH: TSH: 1.96 u[IU]/mL (ref 0.450–4.500)

## 2017-03-02 LAB — LIPID PANEL
CHOL/HDL RATIO: 3.8 ratio (ref 0.0–4.4)
Cholesterol, Total: 195 mg/dL (ref 100–199)
HDL: 52 mg/dL (ref >39)
LDL CALC: 119 mg/dL — AB (ref 0–99)
Triglycerides: 122 mg/dL (ref 0–149)
VLDL CHOLESTEROL CAL: 24 mg/dL (ref 5–40)

## 2017-03-06 ENCOUNTER — Ambulatory Visit: Payer: 59 | Admitting: Family Medicine

## 2017-03-12 ENCOUNTER — Encounter: Payer: Self-pay | Admitting: Family Medicine

## 2017-03-12 ENCOUNTER — Ambulatory Visit (INDEPENDENT_AMBULATORY_CARE_PROVIDER_SITE_OTHER): Payer: 59 | Admitting: Family Medicine

## 2017-03-12 VITALS — BP 132/86

## 2017-03-12 DIAGNOSIS — E079 Disorder of thyroid, unspecified: Secondary | ICD-10-CM

## 2017-03-12 DIAGNOSIS — E785 Hyperlipidemia, unspecified: Secondary | ICD-10-CM | POA: Diagnosis not present

## 2017-03-12 MED ORDER — VALACYCLOVIR HCL 1 G PO TABS: 2000.0000 mg | ORAL_TABLET | Freq: Two times a day (BID) | ORAL | 5 refills | Status: DC

## 2017-03-12 MED ORDER — LEVOTHYROXINE SODIUM 88 MCG PO TABS: ORAL_TABLET | ORAL | 1 refills | Status: DC

## 2017-03-12 MED ORDER — PRAVASTATIN SODIUM 40 MG PO TABS: ORAL_TABLET | ORAL | 1 refills | Status: DC

## 2017-03-12 MED ORDER — CLOBETASOL PROPIONATE 0.05 % EX CREA: 1.0000 "application " | TOPICAL_CREAM | Freq: Two times a day (BID) | CUTANEOUS | 5 refills | Status: DC

## 2017-03-12 NOTE — Progress Notes (Signed)
   Subjective:    Patient ID: Carol Estrada, female    DOB: January 05, 1966, 52 y.o.   MRN: [id number]  Hyperlipidemia  This is a chronic problem. The current episode started more than 1 year ago. There are no compliance problems (eats healthy, takes meds every day, tries to exercise).    Pt states no concerns today.     Results for orders placed or performed in visit on 02/27/17  Lipid panel  Result Value Ref Range   Cholesterol, Total 195 100 - 199 mg/dL   Triglycerides 122 0 - 149 mg/dL   HDL 52 >39 mg/dL   VLDL Cholesterol Cal 24 5 - 40 mg/dL   LDL Calculated 119 (H) 0 - 99 mg/dL   Chol/HDL Ratio 3.8 0.0 - 4.4 ratio  Hepatic function panel  Result Value Ref Range   Total Protein 6.8 6.0 - 8.5 g/dL   Albumin 4.1 3.5 - 5.5 g/dL   Bilirubin Total 0.3 0.0 - 1.2 mg/dL   Bilirubin, Direct 0.09 0.00 - 0.40 mg/dL   Alkaline Phosphatase 66 39 - 117 IU/L   AST 17 0 - 40 IU/L   ALT 13 0 - 32 IU/L  TSH  Result Value Ref Range   TSH 1.960 0.450 - 4.500 uIU/mL    exercise  Patient continues to take lipid medication regularly. No obvious side effects from it. Generally does not miss a dose. Prior blood work results are reviewed with patient. Patient continues to work on fat intake in diet  Starting to maybe gdt into extcise   Review of Systems No headache, no major weight loss or weight gain, no chest pain no back pain abdominal pain no change in bowel habits complete ROS otherwise negative     Objective:   Physical Exam  Alert vitals stable, NAD. Blood pressure good on repeat. HEENT normal. Lungs clear. Heart regular rate and rhythm.  clobet ref and acylco vir      Assessment & Plan:  hypothyr good control.  Discussed.  Compliance discussed blood work reviewed maintain same dose  hyperlip good control.  Compliance discussed.  Blood work reviewed maintain dose  Diet exercise discussed follow-up in 6 months

## 2017-04-03 ENCOUNTER — Ambulatory Visit (INDEPENDENT_AMBULATORY_CARE_PROVIDER_SITE_OTHER): Payer: 59 | Admitting: Nurse Practitioner

## 2017-04-03 ENCOUNTER — Encounter: Payer: Self-pay | Admitting: Nurse Practitioner

## 2017-04-03 VITALS — BP 110/82 | Temp 98.3°F | Ht 64.0 in | Wt 169.0 lb

## 2017-04-03 DIAGNOSIS — J029 Acute pharyngitis, unspecified: Secondary | ICD-10-CM

## 2017-04-03 DIAGNOSIS — J069 Acute upper respiratory infection, unspecified: Secondary | ICD-10-CM | POA: Diagnosis not present

## 2017-04-03 DIAGNOSIS — L049 Acute lymphadenitis, unspecified: Secondary | ICD-10-CM | POA: Diagnosis not present

## 2017-04-03 LAB — POCT RAPID STREP A (OFFICE): RAPID STREP A SCREEN: NEGATIVE

## 2017-04-03 MED ORDER — AZITHROMYCIN 250 MG PO TABS: ORAL_TABLET | ORAL | 0 refills | Status: DC

## 2017-04-03 NOTE — Progress Notes (Signed)
Subjective: Presents for complaints of sore throat and headache that began about a week ago.  No fever.  Had a scratchy throat last week which is now more sore.  Generalized headache on both sides of the head.  No facial area headache.  No runny nose or cough.  No ear pain.  No vomiting diarrhea or abdominal pain.  Describes headache as a pressure or tension type headache.  Also tender anterior lymph nodes.  This is slightly improved.  Objective:   BP 110/82   Temp 98.3 F (36.8 C) (Oral)   Ht 5\' 4"  (1.626 m)   Wt 169 lb (76.7 kg)   BMI 29.01 kg/m  NAD.  Alert, oriented.  TMs significant effusion, no erythema.  Pharynx injected with PND noted.  Neck supple with mild soft anterior adenopathy.  Mildly tender to palpation.  Lungs clear.  Heart regular rate and rhythm.  BP on recheck right arm sitting 144/96.  Patient states this is higher than her usual baseline. Results for orders placed or performed in visit on 04/03/17  POCT rapid strep A  Result Value Ref Range   Rapid Strep A Screen Negative Negative    Assessment:  Acute upper respiratory infection  Sore throat - Plan: POCT rapid strep A, CANCELED: Strep A DNA probe  Acute lymphadenitis    Plan:   Meds ordered this encounter  Medications  . azithromycin (ZITHROMAX Z-PAK) 250 MG tablet    Sig: Take 2 tablets (500 mg) on  Day 1,  followed by 1 tablet (250 mg) once daily on Days 2 through 5.    Dispense:  6 each    Refill:  0    Order Specific Question:   Supervising Provider    Answer:   Mikey Kirschner [[id number]]   OTC meds as directed for symptomatic care.  Check BP at work next week and contact office with results.  Warning signs reviewed.  Call back if symptoms worsen or persist.

## 2017-04-11 ENCOUNTER — Encounter: Payer: Self-pay | Admitting: Nurse Practitioner

## 2017-06-26 ENCOUNTER — Other Ambulatory Visit: Payer: Self-pay

## 2017-06-26 MED ORDER — ACYCLOVIR 400 MG PO TABS: ORAL_TABLET | ORAL | 3 refills | Status: DC

## 2017-06-26 MED ORDER — TRIAMTERENE-HCTZ 37.5-25 MG PO TABS: 1.0000 | ORAL_TABLET | Freq: Every day | ORAL | 0 refills | Status: DC

## 2017-08-08 ENCOUNTER — Encounter: Payer: Self-pay | Admitting: Family Medicine

## 2017-08-08 ENCOUNTER — Ambulatory Visit (INDEPENDENT_AMBULATORY_CARE_PROVIDER_SITE_OTHER): Payer: 59 | Admitting: Family Medicine

## 2017-08-08 VITALS — BP 148/90 | Temp 98.6°F | Ht 64.0 in | Wt 167.2 lb

## 2017-08-08 DIAGNOSIS — N3000 Acute cystitis without hematuria: Secondary | ICD-10-CM | POA: Diagnosis not present

## 2017-08-08 DIAGNOSIS — R300 Dysuria: Secondary | ICD-10-CM | POA: Diagnosis not present

## 2017-08-08 LAB — POCT URINALYSIS DIPSTICK
PH UA: 8 (ref 5.0–8.0)
Spec Grav, UA: <=1.005 — AB (ref 1.010–1.025)

## 2017-08-08 MED ORDER — NITROFURANTOIN MONOHYD MACRO 100 MG PO CAPS: ORAL_CAPSULE | ORAL | 0 refills | Status: DC

## 2017-08-08 NOTE — Progress Notes (Signed)
   Subjective:    Patient ID: Carol Estrada, female    DOB: 05-23-1965, 52 y.o.   MRN: [id number]  Urinary Tract Infection   This is a new problem. The current episode started in the past 7 days. Associated symptoms include frequency. Associated symptoms comments: Painful urination, discomfort, feeling of bladder not being empty, burning when urinating.. She has tried acetaminophen and NSAIDs for the symptoms. The treatment provided mild relief.   Results for orders placed or performed in visit on 08/08/17  POCT Urinalysis Dipstick  Result Value Ref Range   Color, UA     Clarity, UA     Glucose, UA  Negative   Bilirubin, UA +    Ketones, UA     Spec Grav, UA <=1.005 (A) 1.010 - 1.025   Blood, UA     pH, UA 8.0 5.0 - 8.0   Protein, UA  Negative   Urobilinogen, UA  0.2 or 1.0 E.U./dL   Nitrite, UA     Leukocytes, UA Trace (A) Negative   Appearance     Odor     Pos incr freq, freq urin ,  Has had a stressful month   Stressful time at work   incr acid intka e  Noted not feling the best yesterday  Pos hx of no uti for a very long time        Review of Systems  Genitourinary: Positive for frequency.       Objective:   Physical Exam Alert vitals stable, NAD. Blood pressure good on repeat. HEENT normal. Lungs clear. Heart regular rate and rhythm. No true CVA tenderness  Urinalysis 2-4 white blood cells per high-power field       Assessment & Plan:  Impression urinary tract infection.  Symptom care discussed.  Antibiotics prescribed.  Warning signs discussed.  Questions answered.  Culture urine.  Greater than 50% of this 15 minute face to face visit was spent in counseling and discussion and coordination of care regarding the above diagnosis/diagnosies

## 2017-08-12 LAB — URINE CULTURE

## 2017-08-28 ENCOUNTER — Telehealth: Payer: Self-pay | Admitting: Family Medicine

## 2017-08-28 ENCOUNTER — Other Ambulatory Visit: Payer: Self-pay | Admitting: Family Medicine

## 2017-08-28 DIAGNOSIS — Z79899 Other long term (current) drug therapy: Secondary | ICD-10-CM

## 2017-08-28 DIAGNOSIS — E039 Hypothyroidism, unspecified: Secondary | ICD-10-CM

## 2017-08-28 DIAGNOSIS — R5383 Other fatigue: Secondary | ICD-10-CM

## 2017-08-28 DIAGNOSIS — E079 Disorder of thyroid, unspecified: Secondary | ICD-10-CM

## 2017-08-28 DIAGNOSIS — Z114 Encounter for screening for human immunodeficiency virus [HIV]: Secondary | ICD-10-CM

## 2017-08-28 DIAGNOSIS — E785 Hyperlipidemia, unspecified: Secondary | ICD-10-CM

## 2017-08-28 NOTE — Telephone Encounter (Signed)
Left message to return call 

## 2017-08-28 NOTE — Telephone Encounter (Signed)
Lipid, liver, metabolic 7, TSH, HIV antibody Please inform patient that all patients are recommended to have HIV antibody 1 time

## 2017-08-28 NOTE — Telephone Encounter (Signed)
Last labs TSH, hepatic, lipid on 03/01/17

## 2017-08-28 NOTE — Telephone Encounter (Signed)
Pt has appt on 09/10/17, she would like lab orders placed so she can go next week and have those done before appt.

## 2017-08-29 ENCOUNTER — Other Ambulatory Visit: Payer: Self-pay | Admitting: Family Medicine

## 2017-08-29 NOTE — Telephone Encounter (Signed)
Pt returned call and notified that labs were placed. Pt verbalized understanding.

## 2017-08-30 NOTE — Telephone Encounter (Signed)
Pt returned call and stated that she would like to hold off on HIV antibody test because she is not sure that her insurance will cover this. She would like to discuss this with provider at her upcoming appointment.

## 2017-08-30 NOTE — Telephone Encounter (Signed)
That is fine 

## 2017-08-30 NOTE — Telephone Encounter (Signed)
Order for HIV removed.

## 2017-08-31 NOTE — Telephone Encounter (Signed)
ok 

## 2017-09-06 ENCOUNTER — Other Ambulatory Visit: Payer: Self-pay | Admitting: Gynecology

## 2017-09-06 DIAGNOSIS — Z1231 Encounter for screening mammogram for malignant neoplasm of breast: Secondary | ICD-10-CM

## 2017-09-06 LAB — HEPATIC FUNCTION PANEL
ALK PHOS: 68 IU/L (ref 39–117)
ALT: 22 IU/L (ref 0–32)
AST: 18 IU/L (ref 0–40)
Albumin: 4.2 g/dL (ref 3.5–5.5)
BILIRUBIN, DIRECT: 0.1 mg/dL (ref 0.00–0.40)
Bilirubin Total: 0.4 mg/dL (ref 0.0–1.2)
TOTAL PROTEIN: 6.7 g/dL (ref 6.0–8.5)

## 2017-09-06 LAB — TSH: TSH: 3.09 u[IU]/mL (ref 0.450–4.500)

## 2017-09-06 LAB — BASIC METABOLIC PANEL
BUN / CREAT RATIO: 11 (ref 9–23)
BUN: 8 mg/dL (ref 6–24)
CALCIUM: 9.4 mg/dL (ref 8.7–10.2)
CHLORIDE: 105 mmol/L (ref 96–106)
CO2: 21 mmol/L (ref 20–29)
CREATININE: 0.76 mg/dL (ref 0.57–1.00)
GFR calc non Af Amer: 92 mL/min/{1.73_m2} (ref >59)
GFR, EST AFRICAN AMERICAN: 106 mL/min/{1.73_m2} (ref >59)
GLUCOSE: 84 mg/dL (ref 65–99)
Potassium: 4.6 mmol/L (ref 3.5–5.2)
Sodium: 140 mmol/L (ref 134–144)

## 2017-09-06 LAB — LIPID PANEL
CHOLESTEROL TOTAL: 199 mg/dL (ref 100–199)
Chol/HDL Ratio: 3.6 ratio (ref 0.0–4.4)
HDL: 55 mg/dL (ref >39)
LDL CALC: 117 mg/dL — AB (ref 0–99)
Triglycerides: 135 mg/dL (ref 0–149)
VLDL CHOLESTEROL CAL: 27 mg/dL (ref 5–40)

## 2017-09-10 ENCOUNTER — Encounter: Payer: Self-pay | Admitting: Family Medicine

## 2017-09-10 ENCOUNTER — Ambulatory Visit (INDEPENDENT_AMBULATORY_CARE_PROVIDER_SITE_OTHER): Payer: 59 | Admitting: Family Medicine

## 2017-09-10 VITALS — BP 116/78 | Ht 64.0 in | Wt 165.8 lb

## 2017-09-10 DIAGNOSIS — E039 Hypothyroidism, unspecified: Secondary | ICD-10-CM

## 2017-09-10 DIAGNOSIS — E785 Hyperlipidemia, unspecified: Secondary | ICD-10-CM | POA: Diagnosis not present

## 2017-09-10 MED ORDER — PRAVASTATIN SODIUM 40 MG PO TABS: ORAL_TABLET | ORAL | 1 refills | Status: DC

## 2017-09-10 MED ORDER — LEVOTHYROXINE SODIUM 100 MCG PO TABS: 100.0000 ug | ORAL_TABLET | Freq: Every day | ORAL | 1 refills | Status: DC

## 2017-09-10 NOTE — Progress Notes (Signed)
   Subjective:    Patient ID: Carol Estrada, female    DOB: 1965-06-06, 52 y.o.   MRN: [id number]  Hyperlipidemia  This is a chronic problem. Treatments tried: pravastatin. There are no compliance problems.  Risk factors for coronary artery disease include dyslipidemia.   Patient continues to take lipid medication regularly. No obvious side effects from it. Generally does not miss a dose. Prior blood work results are reviewed with patient. Patient continues to work on fat intake in diet  Results for orders placed or performed in visit on 08/28/17  TSH  Result Value Ref Range   TSH 3.090 0.450 - 4.500 uIU/mL  Basic Metabolic Panel (BMET)  Result Value Ref Range   Glucose 84 65 - 99 mg/dL   BUN 8 6 - 24 mg/dL   Creatinine, Ser 0.76 0.57 - 1.00 mg/dL   GFR calc non Af Amer 92 >59 mL/min/1.73   GFR calc Af Amer 106 >59 mL/min/1.73   BUN/Creatinine Ratio 11 9 - 23   Sodium 140 134 - 144 mmol/L   Potassium 4.6 3.5 - 5.2 mmol/L   Chloride 105 96 - 106 mmol/L   CO2 21 20 - 29 mmol/L   Calcium 9.4 8.7 - 10.2 mg/dL  Hepatic function panel  Result Value Ref Range   Total Protein 6.7 6.0 - 8.5 g/dL   Albumin 4.2 3.5 - 5.5 g/dL   Bilirubin Total 0.4 0.0 - 1.2 mg/dL   Bilirubin, Direct 0.10 0.00 - 0.40 mg/dL   Alkaline Phosphatase 68 39 - 117 IU/L   AST 18 0 - 40 IU/L   ALT 22 0 - 32 IU/L  Lipid Profile  Result Value Ref Range   Cholesterol, Total 199 100 - 199 mg/dL   Triglycerides 135 0 - 149 mg/dL   HDL 55 >39 mg/dL   VLDL Cholesterol Cal 27 5 - 40 mg/dL   LDL Calculated 117 (H) 0 - 99 mg/dL   Chol/HDL Ratio 3.6 0.0 - 4.4 ratio   Staying active,  Does  A lot of walking    Diet overall good grad, more vegies       Review of Systems No headache, no major weight loss or weight gain, no chest pain no back pain abdominal pain no change in bowel habits complete ROS otherwise negative     Objective:   Physical Exam  Alert vitals stable, NAD. Blood pressure good on repeat.  HEENT normal. Lungs clear. Heart regular rate and rhythm.       Assessment & Plan:  Impression hyperlipidemia.  Good control discussed maintain same meds  2.  Hypothyroidism.  TSH elevated compared to last.  Discussed.  Will increase levothyroid to 100 mcg rationale discussed  Follow-up in 6 months diet exercise discussed meds refilled

## 2017-09-10 NOTE — Patient Instructions (Signed)
Results for orders placed or performed in visit on 08/28/17  TSH  Result Value Ref Range   TSH 3.090 0.450 - 4.500 uIU/mL  Basic Metabolic Panel (BMET)  Result Value Ref Range   Glucose 84 65 - 99 mg/dL   BUN 8 6 - 24 mg/dL   Creatinine, Ser 0.76 0.57 - 1.00 mg/dL   GFR calc non Af Amer 92 >59 mL/min/1.73   GFR calc Af Amer 106 >59 mL/min/1.73   BUN/Creatinine Ratio 11 9 - 23   Sodium 140 134 - 144 mmol/L   Potassium 4.6 3.5 - 5.2 mmol/L   Chloride 105 96 - 106 mmol/L   CO2 21 20 - 29 mmol/L   Calcium 9.4 8.7 - 10.2 mg/dL  Hepatic function panel  Result Value Ref Range   Total Protein 6.7 6.0 - 8.5 g/dL   Albumin 4.2 3.5 - 5.5 g/dL   Bilirubin Total 0.4 0.0 - 1.2 mg/dL   Bilirubin, Direct 0.10 0.00 - 0.40 mg/dL   Alkaline Phosphatase 68 39 - 117 IU/L   AST 18 0 - 40 IU/L   ALT 22 0 - 32 IU/L  Lipid Profile  Result Value Ref Range   Cholesterol, Total 199 100 - 199 mg/dL   Triglycerides 135 0 - 149 mg/dL   HDL 55 >39 mg/dL   VLDL Cholesterol Cal 27 5 - 40 mg/dL   LDL Calculated 117 (H) 0 - 99 mg/dL   Chol/HDL Ratio 3.6 0.0 - 4.4 ratio

## 2017-09-16 ENCOUNTER — Other Ambulatory Visit: Payer: Self-pay

## 2017-09-16 MED ORDER — NORETHINDRONE-ETH ESTRADIOL 1-35 MG-MCG PO TABS: 1.0000 | ORAL_TABLET | Freq: Every day | ORAL | 0 refills | Status: DC

## 2017-10-10 ENCOUNTER — Ambulatory Visit
Admission: RE | Admit: 2017-10-10 | Discharge: 2017-10-10 | Disposition: A | Discharge status: Home / Self Care | Payer: 59 | Source: Ambulatory Visit | Attending: Gynecology | Admitting: Gynecology

## 2017-10-10 DIAGNOSIS — Z1231 Encounter for screening mammogram for malignant neoplasm of breast: Secondary | ICD-10-CM

## 2017-10-31 ENCOUNTER — Encounter: Payer: Self-pay | Admitting: Gynecology

## 2017-10-31 ENCOUNTER — Ambulatory Visit (INDEPENDENT_AMBULATORY_CARE_PROVIDER_SITE_OTHER): Payer: 59 | Admitting: Gynecology

## 2017-10-31 VITALS — BP 130/80 | Ht 65.0 in | Wt 169.0 lb

## 2017-10-31 DIAGNOSIS — Z01419 Encounter for gynecological examination (general) (routine) without abnormal findings: Secondary | ICD-10-CM | POA: Diagnosis not present

## 2017-10-31 MED ORDER — NORETHINDRONE ACET-ETHINYL EST 1-20 MG-MCG PO TABS: 1.0000 | ORAL_TABLET | Freq: Every day | ORAL | 11 refills | Status: DC

## 2017-10-31 NOTE — Patient Instructions (Signed)
Follow-up in 1 year for annual exam, sooner if any issues. 

## 2017-10-31 NOTE — Progress Notes (Signed)
    Carol Estrada [birth year] [id number]        52 y.o.  G1P1001 for annual gynecologic exam.  Doing well without gynecologic complaints.  Past medical history,surgical history, problem list, medications, allergies, family history and social history were all reviewed and documented as reviewed in the EPIC chart.  ROS:  Performed with pertinent positives and negatives included in the history, assessment and plan.   Additional significant findings : None   Exam: [name] assistant Vitals:   10/31/17 1203  BP: 130/80  Weight: 169 lb (76.7 kg)  Height: 5\' 5"  (1.651 m)   Body mass index is 28.12 kg/m.  General appearance:  Normal affect, orientation and appearance. Skin: Grossly normal HEENT: Without gross lesions.  No cervical or supraclavicular adenopathy. Thyroid normal.  Lungs:  Clear without wheezing, rales or rhonchi Cardiac: RR, without RMG Abdominal:  Soft, nontender, without masses, guarding, rebound, organomegaly or hernia Breasts:  Examined lying and sitting without masses, retractions, discharge or axillary adenopathy. Pelvic:  Ext, BUS, Vagina: Normal  Cervix: Normal  Uterus: Anteverted, normal size, shape and contour, midline and mobile nontender   Adnexa: Without masses or tenderness    Anus and perineum: Normal   Rectovaginal: Normal sphincter tone without palpated masses or tenderness.    Assessment/Plan:  52 y.o. G70P1001 female for annual gynecologic exam with regular menses, oral contraceptives.   1. Oral contraceptives.  Continues on Ortho-Novum 1/35 equivalents.  Started for menstrual regulation as well as contraception.  Discussed increased risk of thrombosis possibly increased with age.  Options to include stopping pills now and keeping menstrual calendar and see if she is not entering menopause versus checking FSH end of pill free week versus continuing the pills either as is or going to a lower estrogen dose pill to at least theoretically decrease her  risk of thrombosis.  After discussing the pros and cons of each choice the patient wants to go ahead and start the lower dose pill.  Loestrin 1/20 equivalent prescribed x1 year. 2. Mammography 09/2017.  Continue with annual mammography next year.  Breast exam normal today. 3. Pap smear 2018.  No Pap smear done today.  No history of abnormal Pap smears previously. 4. History of herpes labialis uses Valtrex 1 g 2 by mouth every 12 hours x1 day as needed.  Has supply but will call if needs more. 5. History of lichen sclerosis had used Temovate in the past although has not had an outbreak for years.  Will call if needs more. 6. Has used Maxide twice weekly as needed for swelling for years.  Has supply but will call if needs more.  Recent CMP with normal potassium/electrolytes 7. Health maintenance.  No routine lab work done as patient does this elsewhere.  Follow-up in 1 year, sooner as needed.   Anastasio Auerbach MD, 12:54 PM 10/31/2017

## 2018-02-10 ENCOUNTER — Other Ambulatory Visit: Payer: Self-pay | Admitting: Gynecology

## 2018-02-19 ENCOUNTER — Other Ambulatory Visit: Payer: Self-pay | Admitting: Family Medicine

## 2018-03-03 ENCOUNTER — Encounter: Payer: Self-pay | Admitting: Family Medicine

## 2018-03-03 DIAGNOSIS — E785 Hyperlipidemia, unspecified: Secondary | ICD-10-CM

## 2018-03-03 DIAGNOSIS — Z79899 Other long term (current) drug therapy: Secondary | ICD-10-CM

## 2018-03-03 DIAGNOSIS — E039 Hypothyroidism, unspecified: Secondary | ICD-10-CM

## 2018-03-03 NOTE — Telephone Encounter (Signed)
Last labs 09/05/17 were tsh, lipid, liver, bmp.

## 2018-03-08 LAB — HEPATIC FUNCTION PANEL
ALT: 23 IU/L (ref 0–32)
AST: 24 IU/L (ref 0–40)
Albumin: 4.6 g/dL (ref 3.8–4.8)
Alkaline Phosphatase: 84 IU/L (ref 39–117)
BILIRUBIN, DIRECT: 0.11 mg/dL (ref 0.00–0.40)
Bilirubin Total: 0.4 mg/dL (ref 0.0–1.2)
Total Protein: 7.3 g/dL (ref 6.0–8.5)

## 2018-03-08 LAB — LIPID PANEL
Chol/HDL Ratio: 3.9 ratio (ref 0.0–4.4)
Cholesterol, Total: 229 mg/dL — ABNORMAL HIGH (ref 100–199)
HDL: 59 mg/dL (ref >39)
LDL CALC: 133 mg/dL — AB (ref 0–99)
TRIGLYCERIDES: 183 mg/dL — AB (ref 0–149)
VLDL CHOLESTEROL CAL: 37 mg/dL (ref 5–40)

## 2018-03-08 LAB — TSH: TSH: 0.666 u[IU]/mL (ref 0.450–4.500)

## 2018-03-11 ENCOUNTER — Encounter: Payer: Self-pay | Admitting: Family Medicine

## 2018-03-11 ENCOUNTER — Ambulatory Visit: Payer: 59 | Admitting: Family Medicine

## 2018-03-11 VITALS — BP 118/80 | Ht 65.0 in | Wt 171.2 lb

## 2018-03-11 DIAGNOSIS — E785 Hyperlipidemia, unspecified: Secondary | ICD-10-CM | POA: Diagnosis not present

## 2018-03-11 DIAGNOSIS — E039 Hypothyroidism, unspecified: Secondary | ICD-10-CM | POA: Diagnosis not present

## 2018-03-11 NOTE — Progress Notes (Signed)
   Subjective:    Patient ID: Carol Estrada, female    DOB: [birth year], 53 y.o.   MRN: [id number]  Hyperlipidemia  This is a chronic problem. The current episode started more than 1 year ago. Treatments tried: pravastatin 40. There are no compliance problems.    Pt states no concerns today.   Patient continues to take lipid medication regularly. No obvious side effects from it. Generally does not miss a dose. Prior blood work results are reviewed with patient. Patient continues to work on fat intake in diet  Results for orders placed or performed in visit on 03/03/18  Lipid panel  Result Value Ref Range   Cholesterol, Total 229 (H) 100 - 199 mg/dL   Triglycerides 183 (H) 0 - 149 mg/dL   HDL 59 >39 mg/dL   VLDL Cholesterol Cal 37 5 - 40 mg/dL   LDL Calculated 133 (H) 0 - 99 mg/dL   Chol/HDL Ratio 3.9 0.0 - 4.4 ratio  Hepatic function panel  Result Value Ref Range   Total Protein 7.3 6.0 - 8.5 g/dL   Albumin 4.6 3.8 - 4.8 g/dL   Bilirubin Total 0.4 0.0 - 1.2 mg/dL   Bilirubin, Direct 0.11 0.00 - 0.40 mg/dL   Alkaline Phosphatase 84 39 - 117 IU/L   AST 24 0 - 40 IU/L   ALT 23 0 - 32 IU/L  TSH  Result Value Ref Range   TSH 0.666 0.450 - 4.500 uIU/mL   Takes mes reg  No fam hx of colon cance    rec   exrecise not the best, ovedrall doing good, ws active up to christmas,,  Uses diuretic once per week or so   Review of Systems No headache, no major weight loss or weight gain, no chest pain no back pain abdominal pain no change in bowel habits complete ROS otherwise negative     Objective:   Physical Exam  Alert vitals stable, NAD. Blood pressure good on repeat. HEENT normal. Lungs clear. Heart regular rate and rhythm.       Assessment & Plan:  Impression hypothyroidism good control discussed maintain same meds  2.  Hyperlipidemia.  Good control overall.  Discussed.  To maintain same  Diet exercise discussed  Colonoscopy encouraged patient states not now due to  insurance considerations follow-up in 6 months

## 2018-04-23 ENCOUNTER — Other Ambulatory Visit: Payer: Self-pay | Admitting: Family Medicine

## 2018-04-28 ENCOUNTER — Other Ambulatory Visit: Payer: Self-pay | Admitting: Family Medicine

## 2018-07-23 ENCOUNTER — Other Ambulatory Visit: Payer: Self-pay | Admitting: Family Medicine

## 2018-09-03 ENCOUNTER — Telehealth: Payer: Self-pay | Admitting: Family Medicine

## 2018-09-03 DIAGNOSIS — E785 Hyperlipidemia, unspecified: Secondary | ICD-10-CM

## 2018-09-03 DIAGNOSIS — E039 Hypothyroidism, unspecified: Secondary | ICD-10-CM

## 2018-09-03 DIAGNOSIS — Z79899 Other long term (current) drug therapy: Secondary | ICD-10-CM

## 2018-09-03 NOTE — Telephone Encounter (Signed)
Same plus met 7 

## 2018-09-03 NOTE — Telephone Encounter (Signed)
Patient is requesting labs for her 6 month follow up on 8/11

## 2018-09-03 NOTE — Telephone Encounter (Signed)
Last labs 03/2018- Lipid, Liver, TSH

## 2018-09-03 NOTE — Telephone Encounter (Signed)
Blood work ordered in Epic. Patient notified. 

## 2018-09-04 DIAGNOSIS — Z79899 Other long term (current) drug therapy: Secondary | ICD-10-CM | POA: Diagnosis not present

## 2018-09-04 DIAGNOSIS — E785 Hyperlipidemia, unspecified: Secondary | ICD-10-CM | POA: Diagnosis not present

## 2018-09-04 DIAGNOSIS — E039 Hypothyroidism, unspecified: Secondary | ICD-10-CM | POA: Diagnosis not present

## 2018-09-05 LAB — LIPID PANEL
Chol/HDL Ratio: 3.5 ratio (ref 0.0–4.4)
Cholesterol, Total: 175 mg/dL (ref 100–199)
HDL: 50 mg/dL (ref >39)
LDL Calculated: 100 mg/dL — ABNORMAL HIGH (ref 0–99)
Triglycerides: 124 mg/dL (ref 0–149)
VLDL Cholesterol Cal: 25 mg/dL (ref 5–40)

## 2018-09-05 LAB — BASIC METABOLIC PANEL
BUN/Creatinine Ratio: 12 (ref 9–23)
BUN: 9 mg/dL (ref 6–24)
CO2: 21 mmol/L (ref 20–29)
Calcium: 9 mg/dL (ref 8.7–10.2)
Chloride: 103 mmol/L (ref 96–106)
Creatinine, Ser: 0.78 mg/dL (ref 0.57–1.00)
GFR calc Af Amer: 102 mL/min/{1.73_m2} (ref >59)
GFR calc non Af Amer: 88 mL/min/{1.73_m2} (ref >59)
Glucose: 83 mg/dL (ref 65–99)
Potassium: 3.7 mmol/L (ref 3.5–5.2)
Sodium: 137 mmol/L (ref 134–144)

## 2018-09-05 LAB — HEPATIC FUNCTION PANEL
ALT: 17 IU/L (ref 0–32)
AST: 21 IU/L (ref 0–40)
Albumin: 3.9 g/dL (ref 3.8–4.9)
Alkaline Phosphatase: 65 IU/L (ref 39–117)
Bilirubin Total: 0.2 mg/dL (ref 0.0–1.2)
Bilirubin, Direct: 0.09 mg/dL (ref 0.00–0.40)
Total Protein: 6.2 g/dL (ref 6.0–8.5)

## 2018-09-05 LAB — TSH: TSH: 0.561 u[IU]/mL (ref 0.450–4.500)

## 2018-09-09 ENCOUNTER — Ambulatory Visit (INDEPENDENT_AMBULATORY_CARE_PROVIDER_SITE_OTHER): Payer: BC Managed Care – PPO | Admitting: Family Medicine

## 2018-09-09 ENCOUNTER — Other Ambulatory Visit: Payer: Self-pay

## 2018-09-09 DIAGNOSIS — E785 Hyperlipidemia, unspecified: Secondary | ICD-10-CM | POA: Diagnosis not present

## 2018-09-09 DIAGNOSIS — E039 Hypothyroidism, unspecified: Secondary | ICD-10-CM

## 2018-09-09 MED ORDER — PRAVASTATIN SODIUM 40 MG PO TABS: ORAL_TABLET | ORAL | 1 refills | Status: DC

## 2018-09-09 MED ORDER — LEVOTHYROXINE SODIUM 100 MCG PO TABS: 100.0000 ug | ORAL_TABLET | Freq: Every day | ORAL | 1 refills | Status: DC

## 2018-09-09 NOTE — Progress Notes (Signed)
Subjective:  Audio  Patient ID: Carol Estrada, female    DOB: 1965/10/19, 53 y.o.   MRN: [id number]  Hyperlipidemia This is a chronic problem. Treatments tried: pravastatin 40mg . Compliance problems: takes meds every day, eats healthy, not much exercise but is active.     Virtual Visit via Telephone Note  I connected with Carol Estrada on 09/09/18 at 10:00 AM EDT by telephone and verified that I am speaking with the correct person using two identifiers.  Location: Patient: home Provider: office   I discussed the limitations, risks, security and privacy concerns of performing an evaluation and management service by telephone and the availability of in person appointments. I also discussed with the patient that there may be a patient responsible charge related to this service. The patient expressed understanding and agreed to proceed.   History of Present Illness:    Observations/Objective:   Assessment and Plan:   Follow Up Instructions:    I discussed the assessment and treatment plan with the patient. The patient was provided an opportunity to ask questions and all were answered. The patient agreed with the plan and demonstrated an understanding of the instructions.   The patient was advised to call back or seek an in-person evaluation if the symptoms worsen or if the condition fails to improve as anticipated.  I provided 17 minutes of non-face-to-face time during this encounter.    Results for orders placed or performed in visit on 09/03/18  Lipid panel  Result Value Ref Range   Cholesterol, Total 175 100 - 199 mg/dL   Triglycerides 124 0 - 149 mg/dL   HDL 50 >39 mg/dL   VLDL Cholesterol Cal 25 5 - 40 mg/dL   LDL Calculated 100 (H) 0 - 99 mg/dL   Chol/HDL Ratio 3.5 0.0 - 4.4 ratio  Basic metabolic panel  Result Value Ref Range   Glucose 83 65 - 99 mg/dL   BUN 9 6 - 24 mg/dL   Creatinine, Ser 0.78 0.57 - 1.00 mg/dL   GFR calc non Af Amer 88 >59 mL/min/1.73    GFR calc Af Amer 102 >59 mL/min/1.73   BUN/Creatinine Ratio 12 9 - 23   Sodium 137 134 - 144 mmol/L   Potassium 3.7 3.5 - 5.2 mmol/L   Chloride 103 96 - 106 mmol/L   CO2 21 20 - 29 mmol/L   Calcium 9.0 8.7 - 10.2 mg/dL  TSH  Result Value Ref Range   TSH 0.561 0.450 - 4.500 uIU/mL  Hepatic function panel  Result Value Ref Range   Total Protein 6.2 6.0 - 8.5 g/dL   Albumin 3.9 3.8 - 4.9 g/dL   Bilirubin Total 0.2 0.0 - 1.2 mg/dL   Bilirubin, Direct 0.09 0.00 - 0.40 mg/dL   Alkaline Phosphatase 65 39 - 117 IU/L   AST 21 0 - 40 IU/L   ALT 17 0 - 32 IU/L   Patient continues to take lipid medication regularly. No obvious side effects from it. Generally does not miss a dose. Prior blood work results are reviewed with patient. Patient continues to work on fat intake in diet  Patient compliant with thyroid medication.  Does not miss a dose.  Energy level overall.   Review of Systems No headache, no major weight loss or weight gain, no chest pain no back pain abdominal pain no change in bowel habits complete ROS otherwise negative     Objective:   Physical Exam   Virtual  Assessment & Plan:  Impression hyperlipidemia good control discussed compliance discussed diet discussed.  Maintain same meds same dose  2.  Hypothyroidism.  Ongoing discussed to maintain same meds compliance encouraged

## 2018-09-13 ENCOUNTER — Encounter: Payer: Self-pay | Admitting: Family Medicine

## 2018-10-01 ENCOUNTER — Encounter: Payer: Self-pay | Admitting: Family Medicine

## 2018-10-22 ENCOUNTER — Encounter: Payer: Self-pay | Admitting: Gynecology

## 2018-11-03 ENCOUNTER — Other Ambulatory Visit: Payer: Self-pay

## 2018-11-04 ENCOUNTER — Encounter: Payer: Self-pay | Admitting: Gynecology

## 2018-11-04 ENCOUNTER — Ambulatory Visit (INDEPENDENT_AMBULATORY_CARE_PROVIDER_SITE_OTHER): Payer: BC Managed Care – PPO | Admitting: Gynecology

## 2018-11-04 VITALS — BP 124/80 | Ht 64.0 in | Wt 167.0 lb

## 2018-11-04 DIAGNOSIS — Z01419 Encounter for gynecological examination (general) (routine) without abnormal findings: Secondary | ICD-10-CM

## 2018-11-04 DIAGNOSIS — N952 Postmenopausal atrophic vaginitis: Secondary | ICD-10-CM | POA: Diagnosis not present

## 2018-11-04 MED ORDER — NORETHINDRONE ACET-ETHINYL EST 1-20 MG-MCG PO TABS: 1.0000 | ORAL_TABLET | Freq: Every day | ORAL | 4 refills | Status: DC

## 2018-11-04 NOTE — Patient Instructions (Signed)
Follow-up in 1 year for annual exam, sooner as needed. 

## 2018-11-04 NOTE — Progress Notes (Signed)
    Carol Estrada 1965-12-22 [id number]        53 y.o.  G1P1001 for annual gynecologic exam.  Doing well without gynecologic complaints  Past medical history,surgical history, problem list, medications, allergies, family history and social history were all reviewed and documented as reviewed in the EPIC chart.  ROS:  Performed with pertinent positives and negatives included in the history, assessment and plan.   Additional significant findings : None   Exam: [name] assistant Vitals:   11/04/18 1556  BP: 124/80  Weight: 167 lb (75.8 kg)  Height: 5\' 4"  (1.626 m)   Body mass index is 28.67 kg/m.  General appearance:  Normal affect, orientation and appearance. Skin: Grossly normal HEENT: Without gross lesions.  No cervical or supraclavicular adenopathy. Thyroid normal.  Lungs:  Clear without wheezing, rales or rhonchi Cardiac: RR, without RMG Abdominal:  Soft, nontender, without masses, guarding, rebound, organomegaly or hernia Breasts:  Examined lying and sitting without masses, retractions, discharge or axillary adenopathy. Pelvic:  Ext, BUS, Vagina: Normal  Cervix: Normal  Uterus: Anteverted, normal size, shape and contour, midline and mobile nontender   Adnexa: Without masses or tenderness    Anus and perineum: Normal   Rectovaginal: Normal sphincter tone without palpated masses or tenderness.    Assessment/Plan:  53 y.o. G98P1001 female for annual gynecologic exam.  With regular menses, oral contraceptives  1. Oral contraceptives.  Continues on Loestrin 1/20 equivalent.  Reviewed risks to include increased risk of thrombosis such as stroke heart attack DVT.  Possible increased risk with age.  Never smoked and not being followed for medical issues.  Options for management include stopping pills now and monitoring, checking FSH during pill free week, continuing for another year and then stopping.  At this point the patient strongly wants to continue for another year.  She  understands and accepts the risks.  Refill x1 year provided. 2. Mammography scheduled and she will follow-up for this.  Breast exam normal today. 3. Pap smear 09/2016.  No Pap smear done today.  No history of significant abnormal Pap smears.  Plan repeat Pap smear next year at 3-year interval. 4. History of herpes labialis.  Uses Valtrex occasionally.  Has not used for a long time and has supply at home. 5. History of lichen sclerosis.  Used Temovate in the past although has not had to use it in a long time.  Exam today overall is normal. 6. Had used Maxide in the past for intermittent swelling although has not used this in a long time. 7. Health maintenance.  No routine lab work done as patient has this done elsewhere.  Follow-up 1 year, sooner as needed.   Anastasio Auerbach MD, 4:26 PM 11/04/2018

## 2019-02-24 ENCOUNTER — Ambulatory Visit: Payer: BC Managed Care – PPO | Attending: Internal Medicine

## 2019-02-24 ENCOUNTER — Other Ambulatory Visit: Payer: Self-pay

## 2019-02-24 ENCOUNTER — Ambulatory Visit (INDEPENDENT_AMBULATORY_CARE_PROVIDER_SITE_OTHER): Payer: BC Managed Care – PPO | Admitting: Family Medicine

## 2019-02-24 DIAGNOSIS — J329 Chronic sinusitis, unspecified: Secondary | ICD-10-CM

## 2019-02-24 DIAGNOSIS — Z20822 Contact with and (suspected) exposure to covid-19: Secondary | ICD-10-CM

## 2019-02-24 DIAGNOSIS — J310 Chronic rhinitis: Secondary | ICD-10-CM | POA: Diagnosis not present

## 2019-02-24 MED ORDER — LEVOTHYROXINE SODIUM 100 MCG PO TABS: 100.0000 ug | ORAL_TABLET | Freq: Every day | ORAL | 1 refills | Status: DC

## 2019-02-24 MED ORDER — PRAVASTATIN SODIUM 40 MG PO TABS: ORAL_TABLET | ORAL | 1 refills | Status: DC

## 2019-02-24 MED ORDER — AZITHROMYCIN 250 MG PO TABS: ORAL_TABLET | ORAL | 0 refills | Status: DC

## 2019-02-24 NOTE — Progress Notes (Signed)
   Subjective:  Audiovideo  Patient ID: Carol Estrada, female    DOB: 10-02-1965, 54 y.o.   MRN: [id number]  Sinusitis This is a new problem. The current episode started yesterday. There has been no fever. Associated symptoms include headaches, sinus pressure and sneezing. Past treatments include acetaminophen (claritin, sudafed).  pt had covid test done this morning.   Virtual Visit via Video Note  I connected with [name] on 02/24/19 at  2:00 PM EST by a video enabled telemedicine application and verified that I am speaking with the correct person using two identifiers.  Location: Patient: home Provider: office   I discussed the limitations of evaluation and management by telemedicine and the availability of in person appointments. The patient expressed understanding and agreed to proceed.  History of Present Illness:    Observations/Objective:   Assessment and Plan:   Follow Up Instructions:    I discussed the assessment and treatment plan with the patient. The patient was provided an opportunity to ask questions and all were answered. The patient agreed with the plan and demonstrated an understanding of the instructions.   The patient was advised to call back or seek an in-person evaluation if the symptoms worsen or if the condition fails to improve as anticipated.  I provided 20 minutes of non-face-to-face time during this encounter.    Alert, mild malaise. Hydration good Vitals stable. frontal/ maxillary tenderness evident positive nasal congestion. pharynx normal neck supple  lungs clear/no crackles or wheezes. heart regular in rhythm     Review of Systems  HENT: Positive for sinus pressure and sneezing.   Neurological: Positive for headaches.       Objective:   Physical Exam   Virtual     Assessment & Plan:  Impression rhinosinusitis likely post viral, discussed with patient. plan antibiotics prescribed. Questions answered. Symptomatic care  discussed. warning signs discussed. WSL The potential for COVID-19 was discussed.  Testing strongly encouraged.  Patient to proceed warning signs discussed avoidance of visiting older relatives even with negative test discussed  2.  Hypothyroidism clinically stable patient compliant with meds  3.  Hyperlipidemia.  Compliant with meds discusse  Chronic medications refilled follow-up in 6 months blood work done

## 2019-02-25 ENCOUNTER — Telehealth: Payer: Self-pay | Admitting: Family Medicine

## 2019-02-25 LAB — NOVEL CORONAVIRUS, NAA: SARS-CoV-2, NAA: DETECTED — AB

## 2019-02-25 NOTE — Telephone Encounter (Signed)
Left message to return call 

## 2019-02-25 NOTE — Telephone Encounter (Signed)
Pt returned call and states that she is aware. Someone from [place] has called her and discussed everything with her

## 2019-02-25 NOTE — Telephone Encounter (Signed)
Call pt, make sure aware, disc usual precautions

## 2019-02-25 NOTE — Telephone Encounter (Signed)
Pt tested positive for COVID on 02/24/2019. Pt did have virtual visit yesterday with provider.

## 2019-03-04 ENCOUNTER — Encounter: Payer: Self-pay | Admitting: Family Medicine

## 2019-03-17 ENCOUNTER — Other Ambulatory Visit: Payer: Self-pay | Admitting: Obstetrics and Gynecology

## 2019-03-17 NOTE — Telephone Encounter (Signed)
Saw Dr.Fontaine 11/04/18 for annual and he wrote "Had used Maxide in the past for intermittent swelling although has not used this in a long time."  Patient has had Rx for is since 2013 and it looks like she gets #90 each year.

## 2019-04-21 ENCOUNTER — Other Ambulatory Visit: Payer: Self-pay | Admitting: Obstetrics and Gynecology

## 2019-04-21 DIAGNOSIS — Z1231 Encounter for screening mammogram for malignant neoplasm of breast: Secondary | ICD-10-CM

## 2019-05-21 ENCOUNTER — Other Ambulatory Visit: Payer: Self-pay

## 2019-05-21 ENCOUNTER — Ambulatory Visit
Admission: RE | Admit: 2019-05-21 | Discharge: 2019-05-21 | Disposition: A | Discharge status: Home / Self Care | Payer: BC Managed Care – PPO | Source: Ambulatory Visit

## 2019-05-21 DIAGNOSIS — Z1231 Encounter for screening mammogram for malignant neoplasm of breast: Secondary | ICD-10-CM

## 2019-05-28 ENCOUNTER — Other Ambulatory Visit: Payer: Self-pay | Admitting: Obstetrics and Gynecology

## 2019-06-01 NOTE — Telephone Encounter (Signed)
I think her primary doctor should refill HTN medication, looks like she has seen him this year.

## 2019-07-21 ENCOUNTER — Other Ambulatory Visit: Payer: Self-pay | Admitting: Family Medicine

## 2019-07-21 DIAGNOSIS — E039 Hypothyroidism, unspecified: Secondary | ICD-10-CM

## 2019-07-21 DIAGNOSIS — R5383 Other fatigue: Secondary | ICD-10-CM

## 2019-07-21 DIAGNOSIS — E785 Hyperlipidemia, unspecified: Secondary | ICD-10-CM

## 2019-07-21 DIAGNOSIS — Z79899 Other long term (current) drug therapy: Secondary | ICD-10-CM

## 2019-07-21 DIAGNOSIS — E079 Disorder of thyroid, unspecified: Secondary | ICD-10-CM

## 2019-07-22 NOTE — Telephone Encounter (Signed)
Scheduled 7/22   Pt would like to know if lab work is needed.

## 2019-07-22 NOTE — Telephone Encounter (Signed)
lvm to schedule appt.  

## 2019-08-12 DIAGNOSIS — E079 Disorder of thyroid, unspecified: Secondary | ICD-10-CM | POA: Diagnosis not present

## 2019-08-12 DIAGNOSIS — E785 Hyperlipidemia, unspecified: Secondary | ICD-10-CM | POA: Diagnosis not present

## 2019-08-12 DIAGNOSIS — Z79899 Other long term (current) drug therapy: Secondary | ICD-10-CM | POA: Diagnosis not present

## 2019-08-12 DIAGNOSIS — R5383 Other fatigue: Secondary | ICD-10-CM | POA: Diagnosis not present

## 2019-08-13 LAB — HEPATIC FUNCTION PANEL
ALT: 20 IU/L (ref 0–32)
AST: 18 IU/L (ref 0–40)
Albumin: 4.2 g/dL (ref 3.8–4.9)
Alkaline Phosphatase: 81 IU/L (ref 48–121)
Bilirubin Total: 0.2 mg/dL (ref 0.0–1.2)
Bilirubin, Direct: 0.09 mg/dL (ref 0.00–0.40)
Total Protein: 6.7 g/dL (ref 6.0–8.5)

## 2019-08-13 LAB — LIPID PANEL
Chol/HDL Ratio: 4.4 ratio (ref 0.0–4.4)
Cholesterol, Total: 212 mg/dL — ABNORMAL HIGH (ref 100–199)
HDL: 48 mg/dL (ref >39)
LDL Chol Calc (NIH): 117 mg/dL — ABNORMAL HIGH (ref 0–99)
Triglycerides: 272 mg/dL — ABNORMAL HIGH (ref 0–149)
VLDL Cholesterol Cal: 47 mg/dL — ABNORMAL HIGH (ref 5–40)

## 2019-08-13 LAB — BASIC METABOLIC PANEL
BUN/Creatinine Ratio: 15 (ref 9–23)
BUN: 12 mg/dL (ref 6–24)
CO2: 26 mmol/L (ref 20–29)
Calcium: 9.3 mg/dL (ref 8.7–10.2)
Chloride: 100 mmol/L (ref 96–106)
Creatinine, Ser: 0.81 mg/dL (ref 0.57–1.00)
GFR calc Af Amer: 97 mL/min/{1.73_m2} (ref >59)
GFR calc non Af Amer: 84 mL/min/{1.73_m2} (ref >59)
Glucose: 106 mg/dL — ABNORMAL HIGH (ref 65–99)
Potassium: 3.8 mmol/L (ref 3.5–5.2)
Sodium: 139 mmol/L (ref 134–144)

## 2019-08-13 LAB — TSH: TSH: 0.681 u[IU]/mL (ref 0.450–4.500)

## 2019-08-20 ENCOUNTER — Encounter: Payer: Self-pay | Admitting: Family Medicine

## 2019-08-20 ENCOUNTER — Other Ambulatory Visit: Payer: Self-pay

## 2019-08-20 ENCOUNTER — Ambulatory Visit: Payer: BC Managed Care – PPO | Admitting: Family Medicine

## 2019-08-20 VITALS — BP 120/72 | HR 92 | Ht 64.0 in | Wt 168.4 lb

## 2019-08-20 DIAGNOSIS — E785 Hyperlipidemia, unspecified: Secondary | ICD-10-CM

## 2019-08-20 DIAGNOSIS — R600 Localized edema: Secondary | ICD-10-CM

## 2019-08-20 DIAGNOSIS — Z79899 Other long term (current) drug therapy: Secondary | ICD-10-CM

## 2019-08-20 DIAGNOSIS — E039 Hypothyroidism, unspecified: Secondary | ICD-10-CM | POA: Diagnosis not present

## 2019-08-20 MED ORDER — LEVOTHYROXINE SODIUM 100 MCG PO TABS: 100.0000 ug | ORAL_TABLET | Freq: Every day | ORAL | 1 refills | Status: DC

## 2019-08-20 MED ORDER — TRIAMTERENE-HCTZ 37.5-25 MG PO TABS: ORAL_TABLET | ORAL | 1 refills | Status: DC

## 2019-08-20 MED ORDER — PRAVASTATIN SODIUM 40 MG PO TABS: ORAL_TABLET | ORAL | 1 refills | Status: DC

## 2019-08-20 NOTE — Progress Notes (Signed)
Patient ID: Carol Estrada, female    DOB: 1965/10/19, 54 y.o.   MRN: [id number]   Chief Complaint  Patient presents with  . Hyperlipidemia    hypothyroidism follow up   Subjective:    HPI  Pt taking triamterene for swelling hands/legs.  Was started by gyn. Taking 1/2 tab prn. Had food prior to labs,  Glucose slightly elevated. tsh 0.681.   Taking loestrin. Seeing gyn for this, taking for menstraul cycle.  Not menopausal yet.  Wanting to think about the colonoscopy or cologuard.  Had covid in 1/21. Recovered okay and had mild symptoms. Working at [employer].  Medical History Jennifier has a past medical history of Eczema, Elevated cholesterol, Endometriosis, H/O cold sores, Infertility, Lichen sclerosus, Perennial allergic rhinitis, Reflux, and Thyroid disease.   Outpatient Encounter Medications as of 08/20/2019  Medication Sig  . acyclovir (ZOVIRAX) 400 MG tablet Take one tab po bid prn cold sore outbreak.  . clobetasol cream (TEMOVATE) 6.75 % Apply 1 application topically 2 (two) times daily. Put on file  . fluticasone (FLONASE) 50 MCG/ACT nasal spray USE 2 SPRAYS IN EACH NOSTRIL ONCE DAILY.  . hydrocortisone 2.5 % cream APPLY TO AFFECTED AREA TWICE DAILY.  Marland Kitchen levothyroxine (SYNTHROID) 100 MCG tablet Take 1 tablet (100 mcg total) by mouth daily.  . Loratadine (CLARITIN PO) Take by mouth.  . norethindrone-ethinyl estradiol (LOESTRIN) 1-20 MG-MCG tablet Take 1 tablet by mouth daily.  . pravastatin (PRAVACHOL) 40 MG tablet TAKE (1) TABLET BY MOUTH DAILY.  Marland Kitchen triamterene-hydrochlorothiazide (MAXZIDE-25) 37.5-25 MG tablet TAKE 1 TABLET BY MOUTH EVERY MORNING AS NEEDED FOR SWELLING.  . valACYclovir (VALTREX) 1000 MG tablet Take 2 tablets (2,000 mg total) by mouth 2 (two) times daily. At earliest onset of outbreak for one day  . [DISCONTINUED] azithromycin (ZITHROMAX) 250 MG tablet Take as directed  . [DISCONTINUED] levothyroxine (SYNTHROID) 100 MCG tablet TAKE 1 TABLET  BY MOUTH ONCE A DAY.  . [DISCONTINUED] pravastatin (PRAVACHOL) 40 MG tablet TAKE (1) TABLET BY MOUTH DAILY.  . [DISCONTINUED] triamterene-hydrochlorothiazide (MAXZIDE-25) 37.5-25 MG tablet TAKE 1 TABLET BY MOUTH EVERY MORNING AS NEEDED FOR SWELLING.   No facility-administered encounter medications on file as of 08/20/2019.     Review of Systems  Constitutional: Negative for chills and fever.  HENT: Negative for congestion, rhinorrhea and sore throat.   Respiratory: Negative for cough, shortness of breath and wheezing.   Cardiovascular: Negative for chest pain and leg swelling.  Gastrointestinal: Negative for abdominal pain, diarrhea, nausea and vomiting.  Genitourinary: Negative for dysuria and frequency.  Musculoskeletal: Negative for arthralgias and back pain.  Skin: Negative for rash.  Neurological: Negative for dizziness, weakness and headaches.     Vitals BP 120/72   Pulse 92   Ht 5\' 4"  (1.626 m)   Wt 168 lb 6.4 oz (76.4 kg)   SpO2 98%   BMI 28.91 kg/m   Objective:   Physical Exam Vitals and nursing note reviewed.  Constitutional:      Appearance: Normal appearance.  HENT:     Head: Normocephalic and atraumatic.     Nose: Nose normal.     Mouth/Throat:     Mouth: Mucous membranes are moist.     Pharynx: Oropharynx is clear.  Eyes:     Extraocular Movements: Extraocular movements intact.     Conjunctiva/sclera: Conjunctivae normal.     Pupils: Pupils are equal, round, and reactive to light.  Cardiovascular:     Rate and Rhythm: Normal rate and regular  rhythm.     Pulses: Normal pulses.     Heart sounds: Normal heart sounds.  Pulmonary:     Effort: Pulmonary effort is normal.     Breath sounds: Normal breath sounds. No wheezing, rhonchi or rales.  Musculoskeletal:        General: Normal range of motion.     Right lower leg: No edema.     Left lower leg: No edema.  Skin:    General: Skin is warm and dry.     Findings: No lesion or rash.  Neurological:      General: No focal deficit present.     Mental Status: She is alert and oriented to person, place, and time.  Psychiatric:        Mood and Affect: Mood normal.        Behavior: Behavior normal.      Assessment and Plan   1. Hyperlipidemia LDL goal <130 - Lipid panel  2. Hypothyroidism, unspecified type - TSH  3. High risk medication use - CBC with Differential/Platelet - Comprehensive metabolic panel  4. Lower leg edema   hld- suboptimal. Worsening since last labs in 8/20. pt to watch diet and eat low cholesterol diet and increase in exercising. Cont with pravastatin.  Elevated glucose pt stating she had food prior to the labs.  Cont to monitor.  Hypothyroidism- labs stable, cont levothyroxine.  Lower leg edema- using trimaterene-hctz prn for lower leg swelling.  F/u gyn for well woman exam.  F/u 77mo for labs, hld, and hypothyriodism.

## 2019-08-27 DIAGNOSIS — R600 Localized edema: Secondary | ICD-10-CM | POA: Insufficient documentation

## 2019-09-04 IMAGING — MG DIGITAL SCREENING BILATERAL MAMMOGRAM WITH CAD
4 series · 4 of 4 positions shown · non-contrast
Comparison: Previous exam(s).

CLINICAL DATA: Screening.

EXAM:
DIGITAL SCREENING BILATERAL MAMMOGRAM WITH CAD

[R CC]
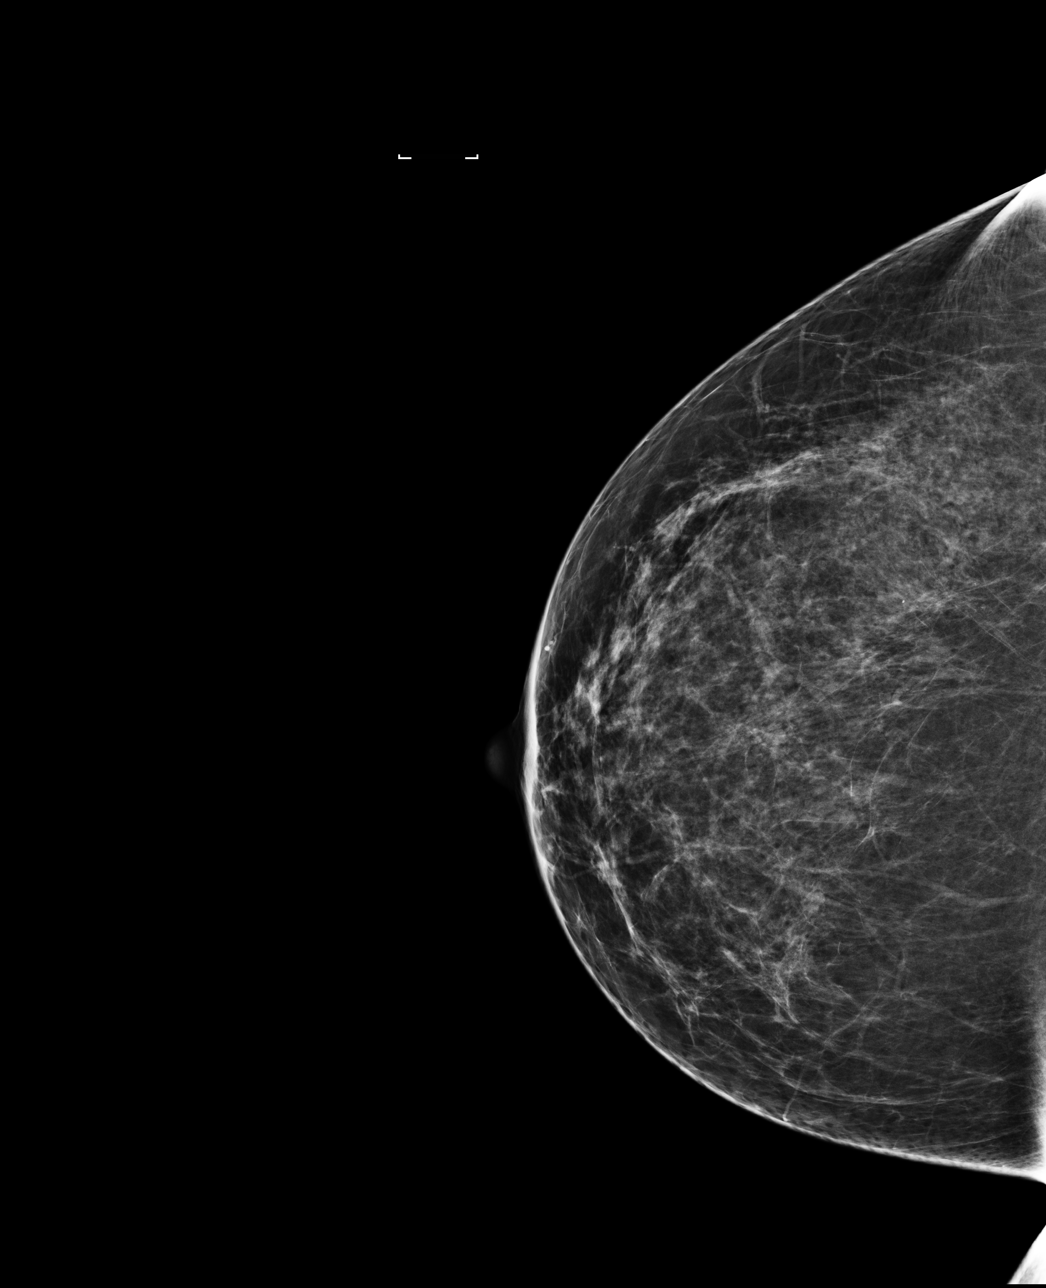

[L CC]
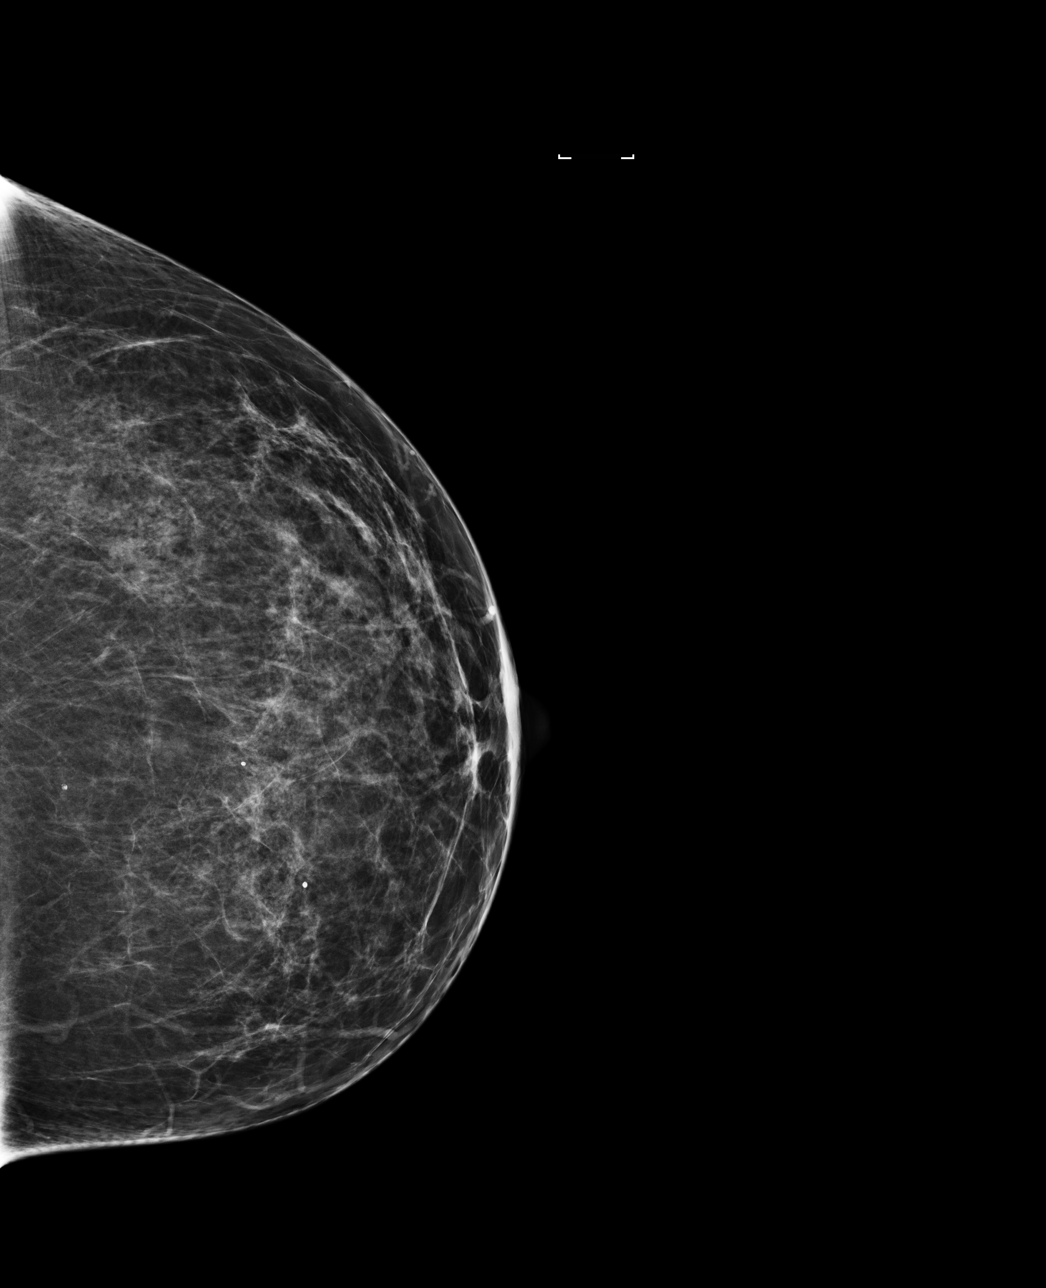

[R MLO]
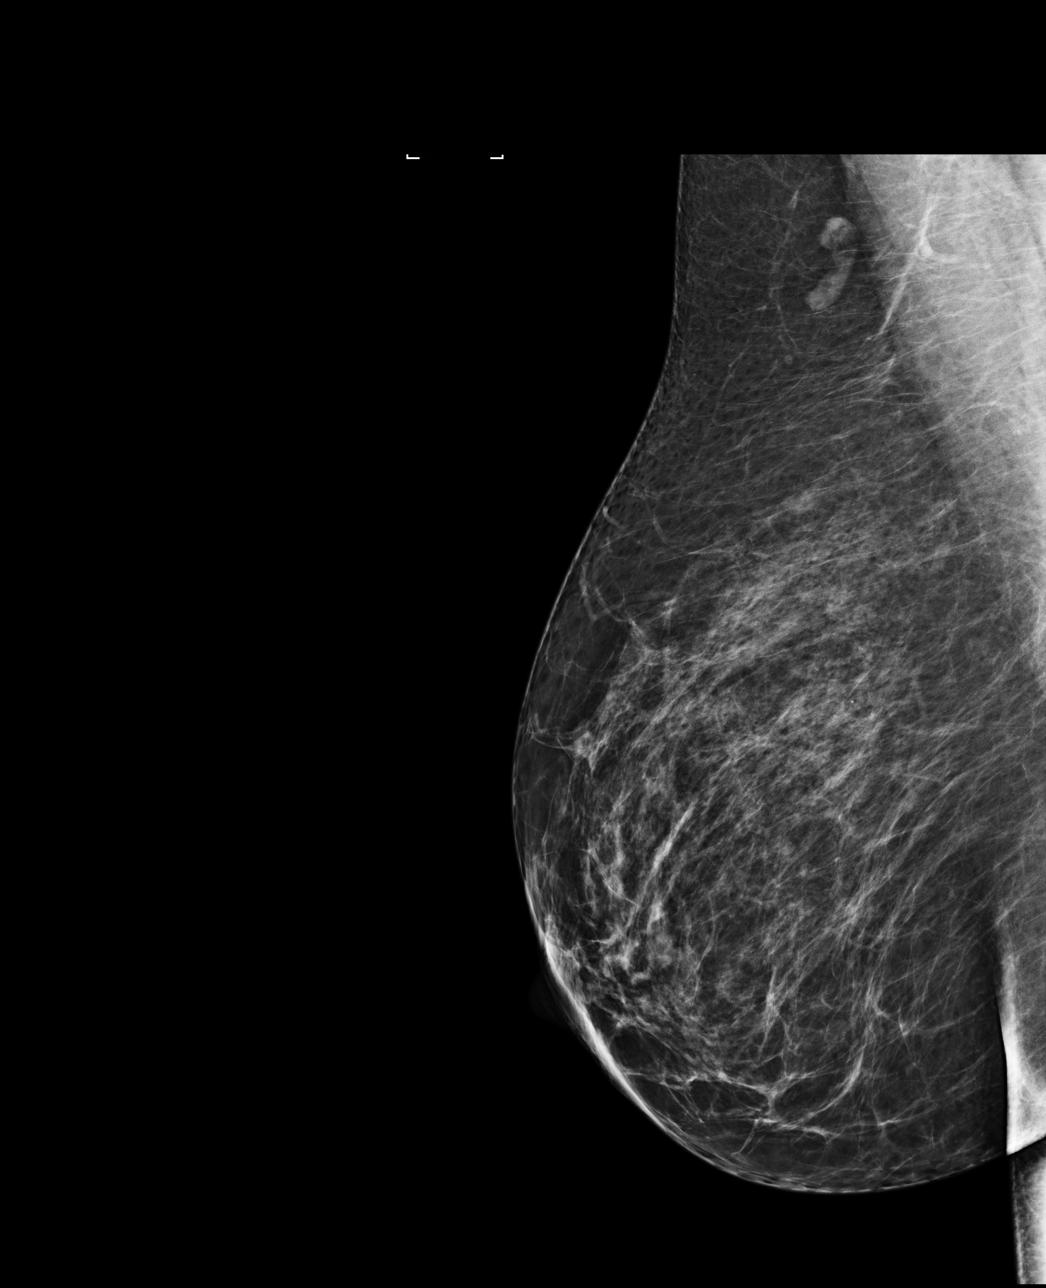

[L MLO]
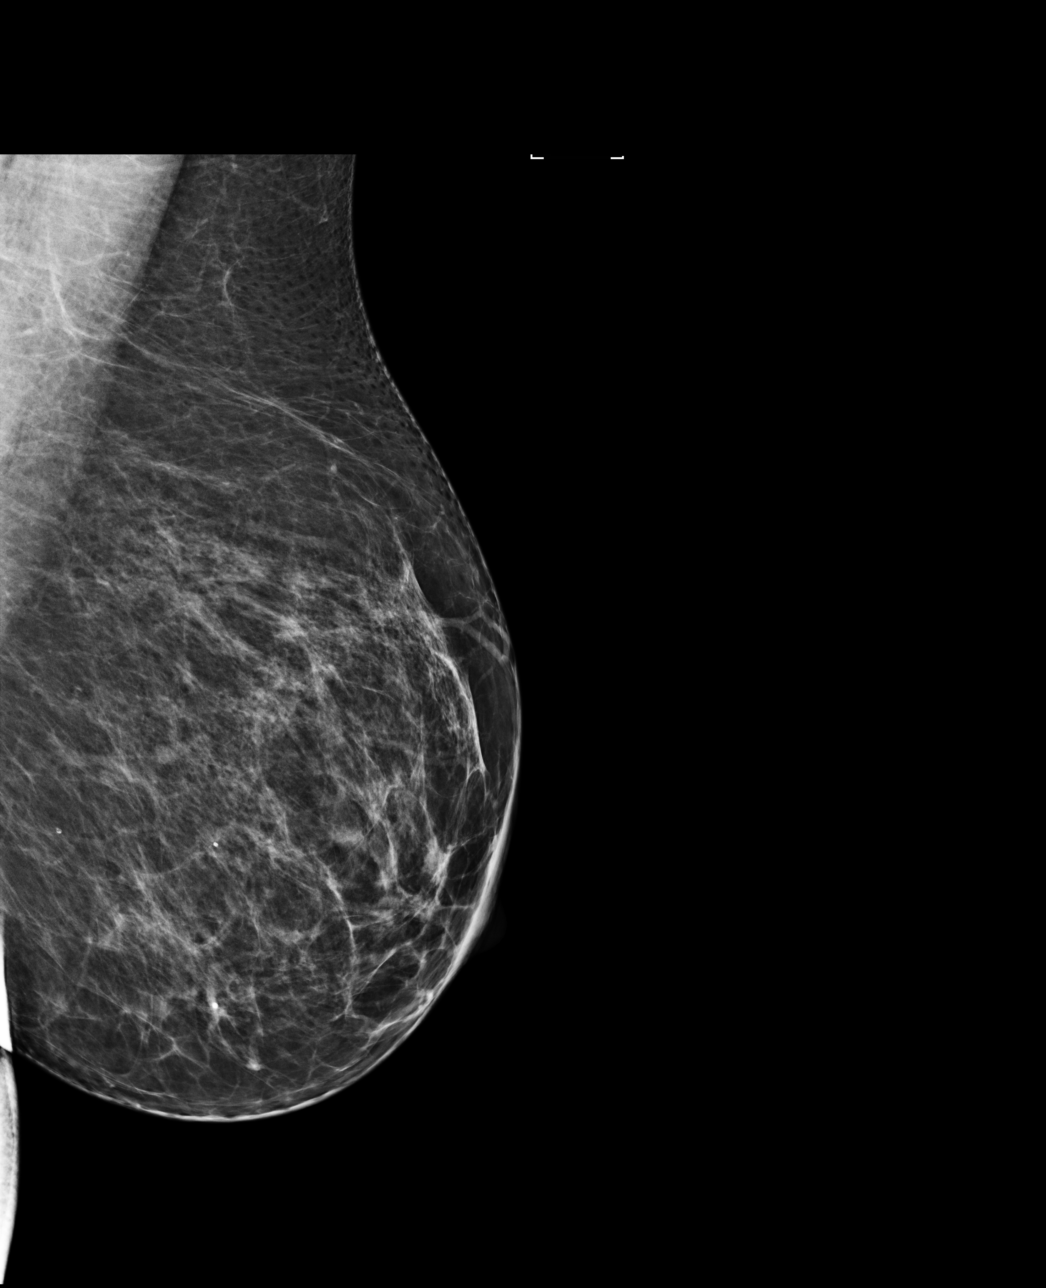

[4 of 4 positions shown; findings below may reference images not displayed]

ACR Breast Density Category b: There are scattered areas of
fibroglandular density.
FINDINGS: There are no findings suspicious for malignancy. Images were
processed with CAD.
IMPRESSION: No mammographic evidence of malignancy. A result letter of this
screening mammogram will be mailed directly to the patient.

RECOMMENDATION:
Screening mammogram in one year. (Code:AS-G-LCT)

BI-RADS CATEGORY  1: Negative.

## 2019-09-23 ENCOUNTER — Other Ambulatory Visit: Payer: Self-pay

## 2019-09-23 ENCOUNTER — Encounter: Payer: Self-pay | Admitting: Family Medicine

## 2019-09-23 ENCOUNTER — Ambulatory Visit: Payer: BC Managed Care – PPO | Admitting: Family Medicine

## 2019-09-23 VITALS — BP 122/82 | HR 98 | Temp 97.1°F | Ht 64.0 in | Wt 170.0 lb

## 2019-09-23 DIAGNOSIS — R1084 Generalized abdominal pain: Secondary | ICD-10-CM | POA: Diagnosis not present

## 2019-09-23 DIAGNOSIS — R300 Dysuria: Secondary | ICD-10-CM

## 2019-09-23 LAB — POCT URINALYSIS DIPSTICK
Spec Grav, UA: 1.015 (ref 1.010–1.025)
pH, UA: 6 (ref 5.0–8.0)

## 2019-09-23 NOTE — Progress Notes (Signed)
Patient ID: CHLOEY RICARD, female    DOB: [birth year], 54 y.o.   MRN: [id number]   Chief Complaint  Patient presents with  . Dysuria    for one week   Subjective:    HPI For one week and been experiencing bladder pressure and increased frequency. Denies fever, denies flank pain.  Medical History Jaquana has a past medical history of Eczema, Elevated cholesterol, Endometriosis, H/O cold sores, Infertility, Lichen sclerosus, Perennial allergic rhinitis, Reflux, and Thyroid disease.   Outpatient Encounter Medications as of 09/23/2019  Medication Sig  . acyclovir (ZOVIRAX) 400 MG tablet Take one tab po bid prn cold sore outbreak.  . clobetasol cream (TEMOVATE) 6.29 % Apply 1 application topically 2 (two) times daily. Put on file  . fluticasone (FLONASE) 50 MCG/ACT nasal spray USE 2 SPRAYS IN EACH NOSTRIL ONCE DAILY.  . hydrocortisone 2.5 % cream APPLY TO AFFECTED AREA TWICE DAILY.  Marland Kitchen levothyroxine (SYNTHROID) 100 MCG tablet Take 1 tablet (100 mcg total) by mouth daily.  . Loratadine (CLARITIN PO) Take by mouth.  . norethindrone-ethinyl estradiol (LOESTRIN) 1-20 MG-MCG tablet Take 1 tablet by mouth daily.  . pravastatin (PRAVACHOL) 40 MG tablet TAKE (1) TABLET BY MOUTH DAILY.  Marland Kitchen triamterene-hydrochlorothiazide (MAXZIDE-25) 37.5-25 MG tablet TAKE 1 TABLET BY MOUTH EVERY MORNING AS NEEDED FOR SWELLING.  . valACYclovir (VALTREX) 1000 MG tablet Take 2 tablets (2,000 mg total) by mouth 2 (two) times daily. At earliest onset of outbreak for one day   No facility-administered encounter medications on file as of 09/23/2019.     Review of Systems  Constitutional: Negative for chills, fever and unexpected weight change.  Gastrointestinal: Positive for constipation.       Reports vague abdominal discomfort  Genitourinary: Positive for dysuria and frequency. Negative for flank pain, hematuria, urgency and vaginal discharge.       Increased urinary pressure and frequency. Negative burning.  Vague abdominal discomfort.  Hematological: Negative for adenopathy.     Vitals BP 122/82   Pulse 98   Temp (!) 97.1 F (36.2 C) (Oral)   Ht 5\' 4"  (1.626 m)   Wt 170 lb (77.1 kg)   SpO2 98%   BMI 29.18 kg/m   Objective:   Physical Exam Constitutional:      Appearance: Normal appearance.  Cardiovascular:     Rate and Rhythm: Normal rate and regular rhythm.     Heart sounds: Normal heart sounds.  Pulmonary:     Effort: Pulmonary effort is normal.     Breath sounds: Normal breath sounds.  Abdominal:     General: There is no distension.     Palpations: Abdomen is soft.     Comments: Slight discomfort with palpation.  Genitourinary:    Comments: Denies vaginal discharge. On birth control pills. Skin:    General: Skin is warm and dry.  Neurological:     Mental Status: She is alert and oriented to person, place, and time.  Psychiatric:        Mood and Affect: Mood normal.        Behavior: Behavior normal.      Assessment and Plan   1. Dysuria - POCT urinalysis dipstick  2. Abdominal discomfort, generalized   Results for orders placed or performed in visit on 09/23/19  POCT urinalysis dipstick  Result Value Ref Range   Color, UA     Clarity, UA     Glucose, UA     Bilirubin, UA     Ketones, UA  Spec Grav, UA 1.015 1.010 - 1.025   Blood, UA     pH, UA 6.0 5.0 - 8.0   Protein, UA     Urobilinogen, UA     Nitrite, UA     Leukocytes, UA     Appearance     Odor     Kierre presents today with one week history of what she thought was a UTI. Her urine is clear today in the office. She reports vague abdominal discomfort. She wonders if this could be related to menopause.   Recommend considering a transvaginal ultrasound to rule out pathology. She has an appointment with her GYN: [place] for late October. She wants to check with her insurance company to see where she should get this ultrasound done.  That is reasonable. She will also check with her  GYN for recommendations.   Agrees with plan of care discussed today. Understands warning signs to seek further care: increased abdominal pain, fever, etc. Understands to follow-up with her GYN and to let us know where/if she wants [place] to order the ultrasound.  She understands that I just want to make sure we don't ignore something that could be significant to her future health.  Pecolia Ades, FNP-C

## 2019-09-23 NOTE — Patient Instructions (Signed)
Decide where is the best place to get a transvaginal ultrasound done- check with your GYN to see and let us know if we need to order it.   Address this with your GYN in October appointment.

## 2019-09-26 ENCOUNTER — Encounter: Payer: Self-pay | Admitting: Family Medicine

## 2019-09-28 ENCOUNTER — Other Ambulatory Visit: Payer: Self-pay | Admitting: Family Medicine

## 2019-09-28 DIAGNOSIS — R300 Dysuria: Secondary | ICD-10-CM

## 2019-09-28 MED ORDER — NITROFURANTOIN MONOHYD MACRO 100 MG PO CAPS: 100.0000 mg | ORAL_CAPSULE | Freq: Two times a day (BID) | ORAL | 0 refills | Status: DC

## 2019-10-15 ENCOUNTER — Encounter: Payer: Self-pay | Admitting: Obstetrics and Gynecology

## 2019-10-15 ENCOUNTER — Ambulatory Visit: Payer: BC Managed Care – PPO | Admitting: Obstetrics and Gynecology

## 2019-10-15 ENCOUNTER — Other Ambulatory Visit: Payer: Self-pay

## 2019-10-15 VITALS — BP 140/84

## 2019-10-15 DIAGNOSIS — N898 Other specified noninflammatory disorders of vagina: Secondary | ICD-10-CM | POA: Diagnosis not present

## 2019-10-15 DIAGNOSIS — N952 Postmenopausal atrophic vaginitis: Secondary | ICD-10-CM | POA: Diagnosis not present

## 2019-10-15 DIAGNOSIS — R350 Frequency of micturition: Secondary | ICD-10-CM

## 2019-10-15 LAB — URINALYSIS, COMPLETE W/RFL CULTURE
Bacteria, UA: NONE SEEN /HPF
Bilirubin Urine: NEGATIVE
Glucose, UA: NEGATIVE
Hgb urine dipstick: NEGATIVE
Hyaline Cast: NONE SEEN /LPF
Ketones, ur: NEGATIVE
Leukocyte Esterase: NEGATIVE
Nitrites, Initial: NEGATIVE
Protein, ur: NEGATIVE
RBC / HPF: NONE SEEN /HPF (ref 0–2)
Specific Gravity, Urine: 1.01 (ref 1.001–1.03)
WBC, UA: NONE SEEN /HPF (ref 0–5)
pH: 6 (ref 5.0–8.0)

## 2019-10-15 LAB — NO CULTURE INDICATED

## 2019-10-15 LAB — WET PREP FOR TRICH, YEAST, CLUE

## 2019-10-15 MED ORDER — ESTRADIOL 0.1 MG/GM VA CREA: 1.0000 | TOPICAL_CREAM | Freq: Every day | VAGINAL | 12 refills | Status: DC

## 2019-10-15 NOTE — Progress Notes (Signed)
Carol Estrada [birth year] [id number]  SUBJECTIVE:  54 y.o. G1P1001 female presents for evaluation of a 3-week history of urinary frequency and back pain.  She says she gets increased urinary urgency and her lower back feels achy especially at night when she is experiencing some hot flashes.  She denies any vaginal burning or itching.  She is not having any sexual dysfunction other than some dryness.  She is feeling her bladder empties but may be not as well as it used to.  She does try to drink 80 ounces of water per day.  She says her bladder often feels "heavy".  First few weeks of the symptoms she used a day or 2 of Azo OTC and found some relief with that.  She went to her primary care provider and they had her do empiric treatment with Macrobid x7 days which she feels initially helps but after stopping the antibiotic the urgency and symptoms returned.  She typically finds her self urinating up to about 6 times per day, sometimes 0-2 times overnight.  She denies any abdominal or pelvic pain.  No urinary significant incontinence other than slight urinary leakage with a coughing spell occurs.  She remains on Loestrin equal that birth control pills that she was switched to last year at her annual exam.  Does get a monthly light..  Has not tried stopping birth control pills in the past few years.  Plan was noted in last note to check [facility] but this was not done.   Current Outpatient Medications  Medication Sig Dispense Refill  . acyclovir (ZOVIRAX) 400 MG tablet Take one tab po bid prn cold sore outbreak. 30 tablet 3  . clobetasol cream (TEMOVATE) 7.09 % Apply 1 application topically 2 (two) times daily. Put on file 30 g 5  . fluticasone (FLONASE) 50 MCG/ACT nasal spray USE 2 SPRAYS IN EACH NOSTRIL ONCE DAILY. 16 g 11  . hydrocortisone 2.5 % cream APPLY TO AFFECTED AREA TWICE DAILY. 30 g 0  . levothyroxine (SYNTHROID) 100 MCG tablet Take 1 tablet (100 mcg total) by mouth daily. 90 tablet 1  . Loratadine  (CLARITIN PO) Take by mouth.    . norethindrone-ethinyl estradiol (LOESTRIN) 1-20 MG-MCG tablet Take 1 tablet by mouth daily. 3 Package 4  . pravastatin (PRAVACHOL) 40 MG tablet TAKE (1) TABLET BY MOUTH DAILY. 90 tablet 1  . valACYclovir (VALTREX) 1000 MG tablet Take 2 tablets (2,000 mg total) by mouth 2 (two) times daily. At earliest onset of outbreak for one day 20 tablet 5  . nitrofurantoin, macrocrystal-monohydrate, (MACROBID) 100 MG capsule Take 1 capsule (100 mg total) by mouth 2 (two) times daily. (Patient not taking: Reported on 10/15/2019) 14 capsule 0  . triamterene-hydrochlorothiazide (MAXZIDE-25) 37.5-25 MG tablet TAKE 1 TABLET BY MOUTH EVERY MORNING AS NEEDED FOR SWELLING. (Patient not taking: Reported on 10/15/2019) 30 tablet 1   No current facility-administered medications for this visit.   Allergies: Ceftin and Penicillins  Patient's last menstrual period was 10/01/2019.  Past medical history,surgical history, problem list, medications, allergies, family history and social history were all reviewed and documented as reviewed in the EPIC chart.  ROS: Positives and negatives as described in HPI.   OBJECTIVE:  BP 140/84   LMP 10/01/2019  The patient appears well, alert, oriented x 3, in no distress. PELVIC EXAM: VULVA: normal appearing vulva with atrophic change particularly over the anterior labial tissues and introitus/periurethral tissues, no masses, tenderness or lesions, VAGINA: normal appearing vagina with normal color  and discharge, no lesions, CERVIX: normal appearing cervix without discharge or lesions, UTERUS: uterus is normal size, shape, consistency and nontender, ADNEXA: normal adnexa in size, nontender and no masses WET MOUNT done - results: negative for pathogens, normal epithelial cells Analysis unremarkable  Chaperone: Aurora Mask (DNP student) present during examination  ASSESSMENT:  54 y.o. G1P1001 here for urinary urgency  PLAN:  We do not have the  ability to test a postvoid residual volume.  Her urinalysis negative.  No evidence of vaginitis with negative vaginal wet mount.   I suspect that she is having some postmenopausal atrophy/vaginitis problems and one of the symptoms that can result from that could be some urinary pattern changes.  I recommended trying a vaginal Estrace 0.1 mg/g cream nightly for 2 weeks and then tapering from there.  Uncertain if she is menopausal or not since she is continuing to cycle while remaining on the OCPs.  I would like to have her discontinue the OCPs but we will address that at her upcoming annual visit in the next few weeks.  If no improvement after a few months of vaginal estrogen therapy, then we should consider a urology referral for work-up of other intrinsic bladder dysfunction conditions.   Joseph Pierini MD 10/15/19

## 2019-10-17 ENCOUNTER — Other Ambulatory Visit: Payer: Self-pay

## 2019-10-17 ENCOUNTER — Ambulatory Visit
Admission: RE | Admit: 2019-10-17 | Discharge: 2019-10-17 | Disposition: A | Discharge status: Home / Self Care | Payer: BC Managed Care – PPO | Source: Ambulatory Visit | Attending: Emergency Medicine | Admitting: Emergency Medicine

## 2019-10-17 VITALS — BP 158/87 | HR 103 | Temp 98.4°F | Resp 17

## 2019-10-17 DIAGNOSIS — L0231 Cutaneous abscess of buttock: Secondary | ICD-10-CM | POA: Diagnosis not present

## 2019-10-17 MED ORDER — SULFAMETHOXAZOLE-TRIMETHOPRIM 800-160 MG PO TABS: 1.0000 | ORAL_TABLET | Freq: Two times a day (BID) | ORAL | 0 refills | Status: AC

## 2019-10-17 NOTE — Discharge Instructions (Signed)
Apply warm compresses 3-4x daily for 10-15 minutes Wash site daily with warm water and mild soap Keep covered to avoid friction Take antibiotic as prescribed and to completion Follow up here or with PCP if symptoms persists Return or go to the ED if you have any new or worsening symptoms increased redness, swelling, pain, nausea, vomiting, fever, chills, etc...  

## 2019-10-17 NOTE — ED Triage Notes (Signed)
Pt has are on back of right leg that is either insect bite or abscess that has not responded with antibiotics

## 2019-10-17 NOTE — ED Provider Notes (Signed)
[place]   [id number] 10/17/19 Arrival Time: 27   CC: ABSCESS  SUBJECTIVE:  Carol Estrada is a 54 y.o. female who presents with a possible abscess of her RT buttock x 2 days.  Denies precipitating event or trauma.  Reports sore to the touch and with sitting.  Has tried OTC medications without relief.  Reports similar symptoms in the past with infection.  Treated with bactrim with relief.  Complains of associated redness, and swelling.  Denies fever, chills, nausea, vomiting.    ROS: As per HPI.  All other pertinent ROS negative.     Past Medical History:  Diagnosis Date  . Eczema   . Elevated cholesterol   . Endometriosis   . H/O cold sores   . Infertility    IVF  . Lichen sclerosus   . Perennial allergic rhinitis   . Reflux   . Thyroid disease    Hypothyroid   Past Surgical History:  Procedure Laterality Date  . CESAREAN SECTION    . PELVIC LAPAROSCOPY    . REFRACTIVE SURGERY    . TONSILLECTOMY AND ADENOIDECTOMY     Allergies  Allergen Reactions  . Ceftin Hives  . Penicillins     Childhood allergy   No current facility-administered medications on file prior to encounter.   Current Outpatient Medications on File Prior to Encounter  Medication Sig Dispense Refill  . acyclovir (ZOVIRAX) 400 MG tablet Take one tab po bid prn cold sore outbreak. 30 tablet 3  . clobetasol cream (TEMOVATE) 7.26 % Apply 1 application topically 2 (two) times daily. Put on file 30 g 5  . estradiol (ESTRACE VAGINAL) 0.1 MG/GM vaginal cream Place 1 Applicatorful vaginally at bedtime. Nightly x 2 weeks, every other night x 2 weeks, then 2 nights per week 42.5 g 12  . fluticasone (FLONASE) 50 MCG/ACT nasal spray USE 2 SPRAYS IN EACH NOSTRIL ONCE DAILY. 16 g 11  . hydrocortisone 2.5 % cream APPLY TO AFFECTED AREA TWICE DAILY. 30 g 0  . levothyroxine (SYNTHROID) 100 MCG tablet Take 1 tablet (100 mcg total) by mouth daily. 90 tablet 1  . Loratadine (CLARITIN PO) Take by mouth.      . nitrofurantoin, macrocrystal-monohydrate, (MACROBID) 100 MG capsule Take 1 capsule (100 mg total) by mouth 2 (two) times daily. (Patient not taking: Reported on 10/15/2019) 14 capsule 0  . norethindrone-ethinyl estradiol (LOESTRIN) 1-20 MG-MCG tablet Take 1 tablet by mouth daily. 3 Package 4  . pravastatin (PRAVACHOL) 40 MG tablet TAKE (1) TABLET BY MOUTH DAILY. 90 tablet 1  . triamterene-hydrochlorothiazide (MAXZIDE-25) 37.5-25 MG tablet TAKE 1 TABLET BY MOUTH EVERY MORNING AS NEEDED FOR SWELLING. (Patient not taking: Reported on 10/15/2019) 30 tablet 1  . valACYclovir (VALTREX) 1000 MG tablet Take 2 tablets (2,000 mg total) by mouth 2 (two) times daily. At earliest onset of outbreak for one day 20 tablet 5   Social History   Socioeconomic History  . Marital status: Married    Spouse name: Not on file  . Number of children: Not on file  . Years of education: Not on file  . Highest education level: Not on file  Occupational History  . Not on file  Tobacco Use  . Smoking status: Never Smoker  . Smokeless tobacco: Never Used  Vaping Use  . Vaping Use: Never used  Substance and Sexual Activity  . Alcohol use: Yes    Alcohol/week: 2.0 standard drinks    Types: 2 Standard drinks or equivalent per  week  . Drug use: No  . Sexual activity: Yes    Birth control/protection: Pill    Comment: 1st intercourse 54 yo-Fewer than 5 partners  Other Topics Concern  . Not on file  Social History Narrative  . Not on file   Social Determinants of Health   Financial Resource Strain:   . Difficulty of Paying Living Expenses: Not on file  Food Insecurity:   . Worried About Charity fundraiser in the Last Year: Not on file  . Ran Out of Food in the Last Year: Not on file  Transportation Needs:   . Lack of Transportation (Medical): Not on file  . Lack of Transportation (Non-Medical): Not on file  Physical Activity:   . Days of Exercise per Week: Not on file  . Minutes of Exercise per Session:  Not on file  Stress:   . Feeling of Stress : Not on file  Social Connections:   . Frequency of Communication with Friends and Family: Not on file  . Frequency of Social Gatherings with Friends and Family: Not on file  . Attends Religious Services: Not on file  . Active Member of Clubs or Organizations: Not on file  . Attends Archivist Meetings: Not on file  . Marital Status: Not on file  Intimate Partner Violence:   . Fear of Current or Ex-Partner: Not on file  . Emotionally Abused: Not on file  . Physically Abused: Not on file  . Sexually Abused: Not on file   Family History  Problem Relation Age of Onset  . Breast cancer Mother        Age 3  . Thyroid disease Mother   . Hypertension Father   . Hyperlipidemia Father   . Heart disease Maternal Grandfather   . Other Paternal Grandmother        Brain tumor  . Heart disease Paternal Grandfather     OBJECTIVE:  Vitals:   10/17/19 1420  BP: (!) 158/87  Pulse: (!) 103  Resp: 17  Temp: 98.4 F (36.9 C)  TempSrc: Tympanic  SpO2: 97%     General appearance: alert; no distress Skin: 2-3 cm induration of her RT buttock with overlying erythema, possible dry skin overlying; tender to touch; no active drainage Psychological: alert and cooperative; normal mood and affect   ASSESSMENT & PLAN:  1. Abscess of buttock, right     Meds ordered this encounter  Medications  . sulfamethoxazole-trimethoprim (BACTRIM DS) 800-160 MG tablet    Sig: Take 1 tablet by mouth 2 (two) times daily for 10 days.    Dispense:  20 tablet    Refill:  0    Order Specific Question:   Supervising Provider    Answer:   Raylene Everts [[id number]]   Apply warm compresses 3-4x daily for 10-15 minutes Wash site daily with warm water and mild soap Keep covered to avoid friction Take antibiotic as prescribed and to completion Follow up here or with PCP if symptoms persists Return or go to the ED if you have any new or worsening symptoms  increased redness, swelling, pain, nausea, vomiting, fever, chills, etc...    Reviewed expectations re: course of current medical issues. Questions answered. Outlined signs and symptoms indicating need for more acute intervention. Patient verbalized understanding. After Visit Summary given.          Lestine Box, PA-C 10/17/19 [phone]

## 2019-11-03 ENCOUNTER — Telehealth: Payer: Self-pay

## 2019-11-03 MED ORDER — ESTRADIOL 0.1 MG/GM VA CREA: 1.0000 | TOPICAL_CREAM | Freq: Every day | VAGINAL | 12 refills | Status: DC

## 2019-11-03 NOTE — Telephone Encounter (Addendum)
Email received from Pharmacy regarding Estradiol Cream" "Applicator doses are for 1, 2, 3 and 4 Grams. How many grams please."  It seems you cannot just write "one applicatorful" but you need to be specific about the quantity.

## 2019-11-03 NOTE — Telephone Encounter (Signed)
1 gram per application

## 2019-11-03 NOTE — Telephone Encounter (Signed)
I revised the directions to include "one gram" instead of one applicatorful and resent the Rx.

## 2019-11-05 ENCOUNTER — Encounter: Payer: BC Managed Care – PPO | Admitting: Obstetrics and Gynecology

## 2019-11-16 ENCOUNTER — Other Ambulatory Visit: Payer: Self-pay | Admitting: Obstetrics and Gynecology

## 2019-11-16 NOTE — Telephone Encounter (Signed)
Annual exam scheduled on 11/18/19. Per note on 10/05/19 "I would like to have her discontinue the OCPs but we will address that at her upcoming annual visit in the next few weeks"  Did you want to deny refill and discuss at this visit on Wednesday?

## 2019-11-17 ENCOUNTER — Other Ambulatory Visit: Payer: Self-pay | Admitting: Obstetrics and Gynecology

## 2019-11-18 ENCOUNTER — Encounter: Payer: Self-pay | Admitting: Obstetrics and Gynecology

## 2019-11-18 ENCOUNTER — Ambulatory Visit (INDEPENDENT_AMBULATORY_CARE_PROVIDER_SITE_OTHER): Payer: BC Managed Care – PPO | Admitting: Obstetrics and Gynecology

## 2019-11-18 ENCOUNTER — Other Ambulatory Visit: Payer: Self-pay | Admitting: Obstetrics and Gynecology

## 2019-11-18 ENCOUNTER — Other Ambulatory Visit: Payer: Self-pay

## 2019-11-18 VITALS — BP 124/76 | Ht 64.5 in | Wt 172.0 lb

## 2019-11-18 DIAGNOSIS — Z3009 Encounter for other general counseling and advice on contraception: Secondary | ICD-10-CM

## 2019-11-18 DIAGNOSIS — Z01419 Encounter for gynecological examination (general) (routine) without abnormal findings: Secondary | ICD-10-CM

## 2019-11-18 DIAGNOSIS — N952 Postmenopausal atrophic vaginitis: Secondary | ICD-10-CM

## 2019-11-18 MED ORDER — NORETHINDRONE ACET-ETHINYL EST 1-20 MG-MCG PO TABS
1.0000 | ORAL_TABLET | Freq: Every day | ORAL | 4 refills | Status: DC
Start: 2019-11-18 — End: 2020-11-25

## 2019-11-18 MED ORDER — VALACYCLOVIR HCL 1 G PO TABS
2000.0000 mg | ORAL_TABLET | Freq: Two times a day (BID) | ORAL | 5 refills | Status: DC
Start: 2019-11-18 — End: 2020-11-25

## 2019-11-18 NOTE — Progress Notes (Signed)
Carol Estrada 1966-01-11 [id number]  SUBJECTIVE:  54 y.o. G1P1001 female for annual routine gynecologic exam and Pap smear. She has no gynecologic concerns.  Feels the estrogen cream has really helped her urinary urgency problem.  Current Outpatient Medications  Medication Sig Dispense Refill  . acyclovir (ZOVIRAX) 400 MG tablet Take one tab po bid prn cold sore outbreak. 30 tablet 3  . clobetasol cream (TEMOVATE) 3.81 % Apply 1 application topically 2 (two) times daily. Put on file 30 g 5  . estradiol (ESTRACE VAGINAL) 0.1 MG/GM vaginal cream Place 1 Applicatorful vaginally at bedtime. Place one gram vaginally nightly x 2 weeks then every other night x 2 weeks, then 2 nights per week. 42.5 g 12  . fluticasone (FLONASE) 50 MCG/ACT nasal spray USE 2 SPRAYS IN EACH NOSTRIL ONCE DAILY. 16 g 11  . hydrocortisone 2.5 % cream APPLY TO AFFECTED AREA TWICE DAILY. 30 g 0  . levothyroxine (SYNTHROID) 100 MCG tablet Take 1 tablet (100 mcg total) by mouth daily. 90 tablet 1  . Loratadine (CLARITIN PO) Take by mouth.    . norethindrone-ethinyl estradiol (LOESTRIN) 1-20 MG-MCG tablet Take 1 tablet by mouth daily. 3 Package 4  . pravastatin (PRAVACHOL) 40 MG tablet TAKE (1) TABLET BY MOUTH DAILY. 90 tablet 1  . valACYclovir (VALTREX) 1000 MG tablet Take 2 tablets (2,000 mg total) by mouth 2 (two) times daily. At earliest onset of outbreak for one day 20 tablet 5  . triamterene-hydrochlorothiazide (MAXZIDE-25) 37.5-25 MG tablet TAKE 1 TABLET BY MOUTH EVERY MORNING AS NEEDED FOR SWELLING. (Patient not taking: Reported on 11/18/2019) 30 tablet 1   No current facility-administered medications for this visit.   Allergies: Ceftin and Penicillins  Patient's last menstrual period was 10/28/2019.  Past medical history,surgical history, problem list, medications, allergies, family history and social history were all reviewed and documented as reviewed in the EPIC chart.  ROS: Pertinent positives and negatives  as reviewed in HPI   OBJECTIVE:  BP 124/76   Ht 5' 4.5" (1.638 m)   Wt 172 lb (78 kg)   LMP 10/28/2019   BMI 29.07 kg/m  The patient appears well, alert, oriented, in no distress. ENT normal.  Neck supple. No cervical or supraclavicular adenopathy or thyromegaly.  Lungs are clear, good air entry, no wheezes, rhonchi or rales. S1 and S2 normal, no murmurs, regular rate and rhythm.  Abdomen soft without tenderness, guarding, mass or organomegaly.  Neurological is normal, no focal findings.  BREAST EXAM: breasts appear normal, no suspicious masses, no skin or nipple changes or axillary nodes  PELVIC EXAM: VULVA: normal appearing vulva with no masses, tenderness or lesions, VAGINA: normal appearing vagina with normal color and discharge, no lesions, CERVIX: normal appearing cervix without discharge or lesions, stenotic external os, UTERUS: uterus is normal size, shape, consistency and nontender, ADNEXA: normal adnexa in size, nontender and no masses, PAP: Pap smear done today, thin-prep method  Chaperone: [name] present during the examination  ASSESSMENT:  54 y.o. G1P1001 here for annual gynecologic exam  PLAN:   1.  Urinary frequency.  We started her on vaginal Estrace cream last month to address this issue as she had a negative UA and no evidence of vaginitis on the wet mount testing.  So far this has greatly improved her symptoms and she will continue to use 1-2 nights per week. 2. Pap smear 09/2016.  No significant history of abnormal Pap smears.  Pap smear is obtained today. 3. Contraception.  Loestrin  1/20 equivalent.  Says she feels better on the combined OCP.  Offered to switch to [facility]o [facility]/progestin only given the risk profile with estrogens especially if she is also using the vaginal estrogen, but she understands the risks and would like to just remain on the combined OCP for now.  Refill x1 year provided.  Discussed that we will give strong consideration to discontinuing  the combined OCP next year and may check [facility] test after that. 4. Mammogram 04/2019.  Breast exam normal.  Continue with annual mammograms. 5. History of lichen sclerosis.  Normal exam today.  Not needing the clobetasol cream currently and has supply will call if she needs more. 6. History of herpes labialis.  Uses Valtrex occasionally.  Refill is provided to her. 7. Cancer screening is recommended starting at age 52. 53. Health maintenance.  No labs today as she normally has these completed with her primary care doctor.  Return annually or sooner, prn.  Joseph Pierini MD 52/20/21

## 2019-11-18 NOTE — Telephone Encounter (Signed)
Yes she should just discuss tomorrow

## 2019-11-18 NOTE — Addendum Note (Signed)
Addended by: Nelva Nay on: 11/18/2019 04:15 PM   Modules accepted: Orders

## 2019-11-18 NOTE — Telephone Encounter (Signed)
Sorry, disregard last message, we did discuss this at her appt today. I went ahead and said we would refill the OCP for 1 more year so refill should have been sent then

## 2019-11-19 LAB — PAP IG W/ RFLX HPV ASCU

## 2020-01-19 ENCOUNTER — Other Ambulatory Visit: Payer: Self-pay | Admitting: Family Medicine

## 2020-02-17 DIAGNOSIS — E785 Hyperlipidemia, unspecified: Secondary | ICD-10-CM | POA: Diagnosis not present

## 2020-02-17 DIAGNOSIS — E039 Hypothyroidism, unspecified: Secondary | ICD-10-CM | POA: Diagnosis not present

## 2020-02-17 DIAGNOSIS — Z79899 Other long term (current) drug therapy: Secondary | ICD-10-CM | POA: Diagnosis not present

## 2020-02-18 LAB — CBC WITH DIFFERENTIAL/PLATELET
Basophils Absolute: 0.1 10*3/uL (ref 0.0–0.2)
Basos: 1 %
EOS (ABSOLUTE): 0.1 10*3/uL (ref 0.0–0.4)
Eos: 1 %
Hematocrit: 46.8 % — ABNORMAL HIGH (ref 34.0–46.6)
Hemoglobin: 15.8 g/dL (ref 11.1–15.9)
Immature Grans (Abs): 0 10*3/uL (ref 0.0–0.1)
Immature Granulocytes: 0 %
Lymphocytes Absolute: 3 10*3/uL (ref 0.7–3.1)
Lymphs: 44 %
MCH: 32.6 pg (ref 26.6–33.0)
MCHC: 33.8 g/dL (ref 31.5–35.7)
MCV: 97 fL (ref 79–97)
Monocytes Absolute: 0.7 10*3/uL (ref 0.1–0.9)
Monocytes: 10 %
Neutrophils Absolute: 3 10*3/uL (ref 1.4–7.0)
Neutrophils: 44 %
Platelets: 288 10*3/uL (ref 150–450)
RBC: 4.84 x10E6/uL (ref 3.77–5.28)
RDW: 11.8 % (ref 11.7–15.4)
WBC: 6.7 10*3/uL (ref 3.4–10.8)

## 2020-02-18 LAB — LIPID PANEL
Chol/HDL Ratio: 3.5 ratio (ref 0.0–4.4)
Cholesterol, Total: 191 mg/dL (ref 100–199)
HDL: 55 mg/dL (ref >39)
LDL Chol Calc (NIH): 109 mg/dL — ABNORMAL HIGH (ref 0–99)
Triglycerides: 153 mg/dL — ABNORMAL HIGH (ref 0–149)
VLDL Cholesterol Cal: 27 mg/dL (ref 5–40)

## 2020-02-18 LAB — COMPREHENSIVE METABOLIC PANEL
ALT: 22 IU/L (ref 0–32)
AST: 17 IU/L (ref 0–40)
Albumin/Globulin Ratio: 1.6 (ref 1.2–2.2)
Albumin: 4.1 g/dL (ref 3.8–4.9)
Alkaline Phosphatase: 75 IU/L (ref 44–121)
BUN/Creatinine Ratio: 13 (ref 9–23)
BUN: 10 mg/dL (ref 6–24)
Bilirubin Total: 0.4 mg/dL (ref 0.0–1.2)
CO2: 20 mmol/L (ref 20–29)
Calcium: 9 mg/dL (ref 8.7–10.2)
Chloride: 101 mmol/L (ref 96–106)
Creatinine, Ser: 0.77 mg/dL (ref 0.57–1.00)
GFR calc Af Amer: 103 mL/min/{1.73_m2} (ref >59)
GFR calc non Af Amer: 89 mL/min/{1.73_m2} (ref >59)
Globulin, Total: 2.6 g/dL (ref 1.5–4.5)
Glucose: 83 mg/dL (ref 65–99)
Potassium: 4.2 mmol/L (ref 3.5–5.2)
Sodium: 137 mmol/L (ref 134–144)
Total Protein: 6.7 g/dL (ref 6.0–8.5)

## 2020-02-18 LAB — TSH: TSH: 1.22 u[IU]/mL (ref 0.450–4.500)

## 2020-02-23 ENCOUNTER — Other Ambulatory Visit: Payer: Self-pay

## 2020-02-23 ENCOUNTER — Encounter: Payer: Self-pay | Admitting: Family Medicine

## 2020-02-23 ENCOUNTER — Ambulatory Visit: Payer: BC Managed Care – PPO | Admitting: Family Medicine

## 2020-02-23 VITALS — BP 126/88 | HR 98 | Temp 97.2°F | Wt 168.4 lb

## 2020-02-23 DIAGNOSIS — R030 Elevated blood-pressure reading, without diagnosis of hypertension: Secondary | ICD-10-CM | POA: Diagnosis not present

## 2020-02-23 DIAGNOSIS — E039 Hypothyroidism, unspecified: Secondary | ICD-10-CM

## 2020-02-23 DIAGNOSIS — E785 Hyperlipidemia, unspecified: Secondary | ICD-10-CM

## 2020-02-23 MED ORDER — LEVOTHYROXINE SODIUM 100 MCG PO TABS: 100.0000 ug | ORAL_TABLET | Freq: Every day | ORAL | 1 refills | Status: DC

## 2020-02-23 MED ORDER — TRIAMTERENE-HCTZ 37.5-25 MG PO TABS: ORAL_TABLET | ORAL | 1 refills | Status: DC

## 2020-02-23 MED ORDER — TRIAMTERENE-HCTZ 37.5-25 MG PO TABS
ORAL_TABLET | ORAL | 1 refills | Status: DC
Start: 2020-02-23 — End: 2020-02-23

## 2020-02-23 MED ORDER — PRAVASTATIN SODIUM 40 MG PO TABS
ORAL_TABLET | ORAL | 1 refills | Status: DC
Start: 2020-02-23 — End: 2020-08-31

## 2020-02-23 NOTE — Progress Notes (Signed)
Patient ID: Carol Estrada, female    DOB: 06-30-1965, 55 y.o.   MRN: [id number]   Chief Complaint  Patient presents with  . Hyperlipidemia  . Hypothyroidism   Subjective:    HPI   F/u HLD and hypothyroidism.  Pt doing well.  Hasn't been sick with covid.  Pt seeing gyn- and taking loestrin cream for the vaginal concerns she was having.  Has improved. And taking bcp taking it for helping with menopausal symptoms. Is going to try to wean off the bcp next year.   HLD- doing well.  Taking meds daily. No body aches or myalgia.  Hypothyroid- No new concerns.  Taking medications daily.  Leg swelling- pt taking prn medication for swelling.  Medical History Eddie has a past medical history of Eczema, Elevated cholesterol, Endometriosis, H/O cold sores, Infertility, Lichen sclerosus, Perennial allergic rhinitis, Reflux, and Thyroid disease.   Outpatient Encounter Medications as of 02/23/2020  Medication Sig  . [DISCONTINUED] triamterene-hydrochlorothiazide (MAXZIDE-25) 37.5-25 MG tablet TAKE 1 TABLET BY MOUTH EVERY MORNING AS NEEDED FOR SWELLING.  Marland Kitchen acyclovir (ZOVIRAX) 400 MG tablet Take one tab po bid prn cold sore outbreak.  . clobetasol cream (TEMOVATE) 3.23 % Apply 1 application topically 2 (two) times daily. Put on file  . estradiol (ESTRACE VAGINAL) 0.1 MG/GM vaginal cream Place 1 Applicatorful vaginally at bedtime. Place one gram vaginally nightly x 2 weeks then every other night x 2 weeks, then 2 nights per week.  . fluticasone (FLONASE) 50 MCG/ACT nasal spray USE 2 SPRAYS IN EACH NOSTRIL ONCE DAILY.  . hydrocortisone 2.5 % cream APPLY TO AFFECTED AREA TWICE DAILY.  Marland Kitchen levothyroxine (SYNTHROID) 100 MCG tablet Take 1 tablet (100 mcg total) by mouth daily.  . Loratadine (CLARITIN PO) Take by mouth.  . norethindrone-ethinyl estradiol (LOESTRIN) 1-20 MG-MCG tablet Take 1 tablet by mouth daily.  . pravastatin (PRAVACHOL) 40 MG tablet TAKE (1) TABLET BY MOUTH DAILY.  Marland Kitchen  triamterene-hydrochlorothiazide (MAXZIDE-25) 37.5-25 MG tablet TAKE 1 TABLET BY MOUTH EVERY MORNING AS NEEDED FOR SWELLING.  . valACYclovir (VALTREX) 1000 MG tablet Take 2 tablets (2,000 mg total) by mouth 2 (two) times daily. At earliest onset of outbreak for one day  . [DISCONTINUED] levothyroxine (SYNTHROID) 100 MCG tablet TAKE 1 TABLET BY MOUTH ONCE A DAY.  . [DISCONTINUED] pravastatin (PRAVACHOL) 40 MG tablet TAKE (1) TABLET BY MOUTH DAILY.  . [DISCONTINUED] triamterene-hydrochlorothiazide (MAXZIDE-25) 37.5-25 MG tablet TAKE 1 TABLET BY MOUTH EVERY MORNING AS NEEDED FOR SWELLING.   No facility-administered encounter medications on file as of 02/23/2020.     Review of Systems  Constitutional: Negative for chills and fever.  HENT: Negative for congestion, rhinorrhea and sore throat.   Respiratory: Negative for cough, shortness of breath and wheezing.   Cardiovascular: Negative for chest pain and leg swelling.  Gastrointestinal: Negative for abdominal pain, diarrhea, nausea and vomiting.  Genitourinary: Negative for dysuria and frequency.  Musculoskeletal: Negative for arthralgias and back pain.  Skin: Negative for rash.  Neurological: Negative for dizziness, weakness and headaches.     Vitals BP 126/88   Pulse 98   Temp (!) 97.2 F (36.2 C)   Wt 168 lb 6.4 oz (76.4 kg)   SpO2 98%   BMI 28.46 kg/m   Objective:   Physical Exam Vitals and nursing note reviewed.  Constitutional:      Appearance: Normal appearance.  HENT:     Head: Normocephalic and atraumatic.     Nose: Nose normal.     Mouth/Throat:  Mouth: Mucous membranes are moist.     Pharynx: Oropharynx is clear.  Eyes:     Extraocular Movements: Extraocular movements intact.     Conjunctiva/sclera: Conjunctivae normal.     Pupils: Pupils are equal, round, and reactive to light.  Cardiovascular:     Rate and Rhythm: Normal rate and regular rhythm.     Pulses: Normal pulses.     Heart sounds: Normal heart  sounds.  Pulmonary:     Effort: Pulmonary effort is normal.     Breath sounds: Normal breath sounds. No wheezing, rhonchi or rales.  Musculoskeletal:        General: Normal range of motion.     Right lower leg: No edema.     Left lower leg: No edema.  Skin:    General: Skin is warm and dry.     Findings: No lesion or rash.  Neurological:     General: No focal deficit present.     Mental Status: She is alert and oriented to person, place, and time.  Psychiatric:        Mood and Affect: Mood normal.        Behavior: Behavior normal.      Assessment and Plan   1. Hyperlipidemia LDL goal <130  2. Elevated blood-pressure reading, without diagnosis of hypertension  3. Hypothyroidism, unspecified type   Elevated bp- diastolic elevated at 88.  Will advising to dec salt intake and will recheck on next visit.  Usually bp are controlled on last few visits.  Cont triamterene/hctz.  hld- doing well. Will cont meds.  Hypothyroidism- stable. Cont meds.  F/u 50mo or prn.

## 2020-07-28 DIAGNOSIS — Z79899 Other long term (current) drug therapy: Secondary | ICD-10-CM

## 2020-07-28 DIAGNOSIS — E785 Hyperlipidemia, unspecified: Secondary | ICD-10-CM

## 2020-07-28 DIAGNOSIS — E039 Hypothyroidism, unspecified: Secondary | ICD-10-CM

## 2020-08-18 DIAGNOSIS — E785 Hyperlipidemia, unspecified: Secondary | ICD-10-CM | POA: Diagnosis not present

## 2020-08-18 DIAGNOSIS — E039 Hypothyroidism, unspecified: Secondary | ICD-10-CM | POA: Diagnosis not present

## 2020-08-18 DIAGNOSIS — Z79899 Other long term (current) drug therapy: Secondary | ICD-10-CM | POA: Diagnosis not present

## 2020-08-19 LAB — LIPID PANEL
Chol/HDL Ratio: 3.8 ratio (ref 0.0–4.4)
Cholesterol, Total: 219 mg/dL — ABNORMAL HIGH (ref 100–199)
HDL: 57 mg/dL (ref >39)
LDL Chol Calc (NIH): 142 mg/dL — ABNORMAL HIGH (ref 0–99)
Triglycerides: 112 mg/dL (ref 0–149)
VLDL Cholesterol Cal: 20 mg/dL (ref 5–40)

## 2020-08-19 LAB — COMPREHENSIVE METABOLIC PANEL
ALT: 21 IU/L (ref 0–32)
AST: 19 IU/L (ref 0–40)
Albumin/Globulin Ratio: 1.7 (ref 1.2–2.2)
Albumin: 3.9 g/dL (ref 3.8–4.9)
Alkaline Phosphatase: 74 IU/L (ref 44–121)
BUN/Creatinine Ratio: 13 (ref 9–23)
BUN: 9 mg/dL (ref 6–24)
Bilirubin Total: <0.2 mg/dL (ref 0.0–1.2)
CO2: 20 mmol/L (ref 20–29)
Calcium: 9.2 mg/dL (ref 8.7–10.2)
Chloride: 104 mmol/L (ref 96–106)
Creatinine, Ser: 0.71 mg/dL (ref 0.57–1.00)
Globulin, Total: 2.3 g/dL (ref 1.5–4.5)
Glucose: 86 mg/dL (ref 65–99)
Potassium: 4.5 mmol/L (ref 3.5–5.2)
Sodium: 140 mmol/L (ref 134–144)
Total Protein: 6.2 g/dL (ref 6.0–8.5)
eGFR: 102 mL/min/{1.73_m2} (ref >59)

## 2020-08-19 LAB — CBC WITH DIFFERENTIAL/PLATELET
Basophils Absolute: 0.1 10*3/uL (ref 0.0–0.2)
Basos: 1 %
EOS (ABSOLUTE): 0.1 10*3/uL (ref 0.0–0.4)
Eos: 2 %
Hematocrit: 45.8 % (ref 34.0–46.6)
Hemoglobin: 15.5 g/dL (ref 11.1–15.9)
Immature Grans (Abs): 0 10*3/uL (ref 0.0–0.1)
Immature Granulocytes: 0 %
Lymphocytes Absolute: 2.7 10*3/uL (ref 0.7–3.1)
Lymphs: 46 %
MCH: 32.7 pg (ref 26.6–33.0)
MCHC: 33.8 g/dL (ref 31.5–35.7)
MCV: 97 fL (ref 79–97)
Monocytes Absolute: 0.8 10*3/uL (ref 0.1–0.9)
Monocytes: 13 %
Neutrophils Absolute: 2.3 10*3/uL (ref 1.4–7.0)
Neutrophils: 38 %
Platelets: 310 10*3/uL (ref 150–450)
RBC: 4.74 x10E6/uL (ref 3.77–5.28)
RDW: 12.2 % (ref 11.7–15.4)
WBC: 6 10*3/uL (ref 3.4–10.8)

## 2020-08-19 LAB — TSH: TSH: 2.44 u[IU]/mL (ref 0.450–4.500)

## 2020-08-31 ENCOUNTER — Encounter: Payer: Self-pay | Admitting: Family Medicine

## 2020-08-31 ENCOUNTER — Telehealth: Payer: Self-pay | Admitting: *Deleted

## 2020-08-31 ENCOUNTER — Ambulatory Visit (INDEPENDENT_AMBULATORY_CARE_PROVIDER_SITE_OTHER): Payer: BC Managed Care – PPO | Admitting: Family Medicine

## 2020-08-31 ENCOUNTER — Other Ambulatory Visit: Payer: Self-pay

## 2020-08-31 DIAGNOSIS — E785 Hyperlipidemia, unspecified: Secondary | ICD-10-CM

## 2020-08-31 DIAGNOSIS — R600 Localized edema: Secondary | ICD-10-CM | POA: Diagnosis not present

## 2020-08-31 DIAGNOSIS — E039 Hypothyroidism, unspecified: Secondary | ICD-10-CM

## 2020-08-31 MED ORDER — TRIAMTERENE-HCTZ 37.5-25 MG PO TABS: ORAL_TABLET | ORAL | 0 refills | Status: DC

## 2020-08-31 MED ORDER — LEVOTHYROXINE SODIUM 100 MCG PO TABS: 100.0000 ug | ORAL_TABLET | Freq: Every day | ORAL | 1 refills | Status: DC

## 2020-08-31 MED ORDER — PRAVASTATIN SODIUM 40 MG PO TABS: ORAL_TABLET | ORAL | 1 refills | Status: DC

## 2020-08-31 NOTE — Progress Notes (Signed)
Patient ID: Carol Estrada, female    DOB: 10-Jan-1966, 55 y.o.   MRN: [id number]   Chief Complaint  Patient presents with   Hyperlipidemia    Hypothyroidism follow up   Subjective:    HPI Cc- hld, lower leg swelling, and hypothyroidism.  H/o lower leg swelling- stable. Pt doing well on "water pill" triamterene and hctz.  Not taking daily.  Just taking when seeing lower leg swelling. 1-2x per week, now every other week.  Hypothyroidism- Not having any side effects from thyroid meds.  Compliant with meds. No diarrhea, constipation, depression/anxiety, palpitations, excessive hair loss or weight gain/loss. No heat/cold intolerance. Thyroid meds, pt taking them daily- doing well.  Had elevated cholesterol-  Had been eating more cholesterol in diet. Not on a medication for this.  Medical History Carol Estrada has a past medical history of Eczema, Elevated cholesterol, Endometriosis, H/O cold sores, Infertility, Lichen sclerosus, Perennial allergic rhinitis, Reflux, and Thyroid disease.   Outpatient Encounter Medications as of 08/31/2020  Medication Sig   acyclovir (ZOVIRAX) 400 MG tablet Take one tab po bid prn cold sore outbreak.   clobetasol cream (TEMOVATE) [id number] % Apply 1 application topically 2 (two) times daily. Put on file   estradiol (ESTRACE VAGINAL) 0.1 MG/GM vaginal cream Place 1 Applicatorful vaginally at bedtime. Place one gram vaginally nightly x 2 weeks then every other night x 2 weeks, then 2 nights per week.   fluticasone (FLONASE) 50 MCG/ACT nasal spray USE 2 SPRAYS IN EACH NOSTRIL ONCE DAILY.   hydrocortisone 2.5 % cream APPLY TO AFFECTED AREA TWICE DAILY.   levothyroxine (SYNTHROID) 100 MCG tablet Take 1 tablet (100 mcg total) by mouth daily.   Loratadine (CLARITIN PO) Take by mouth.   norethindrone-ethinyl estradiol (LOESTRIN) 1-20 MG-MCG tablet Take 1 tablet by mouth daily.   pravastatin (PRAVACHOL) 40 MG tablet TAKE (1) TABLET BY MOUTH DAILY.    triamterene-hydrochlorothiazide (MAXZIDE-25) 37.5-25 MG tablet TAKE 1 TABLET BY MOUTH EVERY MORNING AS NEEDED FOR SWELLING.   valACYclovir (VALTREX) 1000 MG tablet Take 2 tablets (2,000 mg total) by mouth 2 (two) times daily. At earliest onset of outbreak for one day   [DISCONTINUED] levothyroxine (SYNTHROID) 100 MCG tablet Take 1 tablet (100 mcg total) by mouth daily.   [DISCONTINUED] pravastatin (PRAVACHOL) 40 MG tablet TAKE (1) TABLET BY MOUTH DAILY.   [DISCONTINUED] triamterene-hydrochlorothiazide (MAXZIDE-25) 37.5-25 MG tablet TAKE 1 TABLET BY MOUTH EVERY MORNING AS NEEDED FOR SWELLING.   No facility-administered encounter medications on file as of 08/31/2020.     Review of Systems  Constitutional:  Negative for chills and fever.  HENT:  Negative for congestion, rhinorrhea and sore throat.   Respiratory:  Negative for cough, shortness of breath and wheezing.   Cardiovascular:  Negative for chest pain and leg swelling.  Gastrointestinal:  Negative for abdominal pain, diarrhea, nausea and vomiting.  Genitourinary:  Negative for dysuria and frequency.  Musculoskeletal:  Negative for arthralgias and back pain.  Skin:  Negative for rash.  Neurological:  Negative for dizziness, weakness and headaches.    Vitals There were no vitals taken for this visit.   Objective:   Physical Exam No PE due to phone visit.  Assessment and Plan   1. Hypothyroidism, unspecified type  2. Lower leg edema  3. Hyperlipidemia LDL goal <130   Reviewed labs. Stable.  Pt to cont meds.  Hld- slightly elevated. Pt will cont to work on diet modifications.  No follow-ups on file.   Virtual Visit via Telephone  Note  I connected with Carol Estrada on 08/31/20 at  1:50 PM EDT by telephone and verified that I am speaking with the correct person using two identifiers.  Location: Patient: home Provider: office   I discussed the limitations, risks, security and privacy concerns of performing an  evaluation and management service by telephone and the availability of in person appointments. I also discussed with the patient that there may be a patient responsible charge related to this service. The patient expressed understanding and agreed to proceed.   Follow Up Instructions:    I discussed the assessment and treatment plan with the patient. The patient was provided an opportunity to ask questions and all were answered. The patient agreed with the plan and demonstrated an understanding of the instructions.   The patient was advised to call back or seek an in-person evaluation if the symptoms worsen or if the condition fails to improve as anticipated.  I provided 15 minutes of non-face-to-face time during this encounter.

## 2020-08-31 NOTE — Telephone Encounter (Signed)
[name], overcash are scheduled for a virtual visit with your provider today.    Just as we do with appointments in the office, we must obtain your consent to participate.  Your consent will be active for this visit and any virtual visit you may have with one of our providers in the next 365 days.    If you have a MyChart account, I can also send a copy of this consent to you electronically.  All virtual visits are billed to your insurance company just like a traditional visit in the office.  As this is a virtual visit, video technology does not allow for your provider to perform a traditional examination.  This may limit your provider's ability to fully assess your condition.  If your provider identifies any concerns that need to be evaluated in person or the need to arrange testing such as labs, EKG, etc, we will make arrangements to do so.    Although advances in technology are sophisticated, we cannot ensure that it will always work on either your end or our end.  If the connection with a video visit is poor, we may have to switch to a telephone visit.  With either a video or telephone visit, we are not always able to ensure that we have a secure connection.   I need to obtain your verbal consent now.   Are you willing to proceed with your visit today?   Carol Estrada has provided verbal consent on 08/31/2020 for a virtual visit (video or telephone).

## 2020-11-22 ENCOUNTER — Ambulatory Visit: Payer: BC Managed Care – PPO | Admitting: Nurse Practitioner

## 2020-11-25 ENCOUNTER — Ambulatory Visit (INDEPENDENT_AMBULATORY_CARE_PROVIDER_SITE_OTHER): Payer: BC Managed Care – PPO | Admitting: Nurse Practitioner

## 2020-11-25 ENCOUNTER — Other Ambulatory Visit: Payer: Self-pay

## 2020-11-25 ENCOUNTER — Ambulatory Visit: Payer: BC Managed Care – PPO | Admitting: Nurse Practitioner

## 2020-11-25 ENCOUNTER — Encounter: Payer: Self-pay | Admitting: Nurse Practitioner

## 2020-11-25 VITALS — BP 120/74 | Ht 63.0 in | Wt 169.0 lb

## 2020-11-25 DIAGNOSIS — Z3041 Encounter for surveillance of contraceptive pills: Secondary | ICD-10-CM | POA: Diagnosis not present

## 2020-11-25 DIAGNOSIS — L900 Lichen sclerosus et atrophicus: Secondary | ICD-10-CM | POA: Diagnosis not present

## 2020-11-25 DIAGNOSIS — Z01419 Encounter for gynecological examination (general) (routine) without abnormal findings: Secondary | ICD-10-CM

## 2020-11-25 MED ORDER — NORETHINDRONE ACET-ETHINYL EST 1-20 MG-MCG PO TABS: 1.0000 | ORAL_TABLET | Freq: Every day | ORAL | 4 refills | Status: DC

## 2020-11-25 NOTE — Progress Notes (Signed)
Carol Estrada 02/24/1965 [id number]   History:  55 y.o. G1P1001 presents for annual exam. Was prescribed vaginal estrogen for symptoms of urinary frequency with good improvement. Has not used for months. Light monthly cycles. OCPs. Normal pap history. History of Lichen sclerosus, HSV, hypothyroidism.   Gynecologic History Patient's last menstrual period was 11/04/2020. Period Cycle (Days): 28 Period Duration (Days): 4 Period Pattern: Regular Menstrual Flow: Light Dysmenorrhea: None Contraception/Family planning: OCP (estrogen/progesterone) Sexually active: Yes  Health Maintenance Last Pap: 11/18/2019. Results were: Normal, 3-year repeat Last mammogram: 05/21/2019. Results were: Normal Last colonoscopy: Never Last Dexa: Not indicated  Past medical history, past surgical history, family history and social history were all reviewed and documented in the EPIC chart. Married. Works at [employer] 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 Mother diagnosed with breast cancer at age 59.   ROS:  A ROS was performed and pertinent positives and negatives are included.  Exam:  Vitals:   11/25/20 0952  BP: 120/74  Weight: 169 lb (76.7 kg)  Height: 5\' 3"  (1.6 m)   Body mass index is 29.94 kg/m.  General appearance:  Normal Thyroid:  Symmetrical, normal in size, without palpable masses or nodularity. Respiratory  Auscultation:  Clear without wheezing or rhonchi Cardiovascular  Auscultation:  Regular rate, without rubs, murmurs or gallops  Edema/varicosities:  Not grossly evident Abdominal  Soft,nontender, without masses, guarding or rebound.  Liver/spleen:  No organomegaly noted  Hernia:  None appreciated  Skin  Inspection:  Grossly normal Breasts: Examined lying and sitting.   Right: Without masses, retractions, nipple discharge or axillary adenopathy.   Left: Without masses, retractions, nipple discharge or axillary adenopathy. Genitourinary   Inguinal/mons:   Normal without inguinal adenopathy  External genitalia:  Normal appearing vulva with no masses, tenderness, or lesions  BUS/Urethra/Skene's glands:  Normal  Vagina:  Normal appearing with normal color and discharge, no lesions  Cervix:  Normal appearing without discharge or lesions  Uterus:  Normal in size, shape and contour.  Midline and mobile, nontender  Adnexa/parametria:     Rt: Normal in size, without masses or tenderness.   Lt: Normal in size, without masses or tenderness.  Anus and perineum: Pearly appearance consistent with LS  Digital rectal exam: Normal sphincter tone without palpated masses or tenderness  Patient informed chaperone available to be present for breast and pelvic exam. Patient has requested no chaperone to be present. Patient has been advised what will be completed during breast and pelvic exam.   Assessment/Plan:  55 y.o. G1P1001 for annual exam.   Well female exam with routine gynecological exam - Education provided on SBEs, importance of preventative screenings, current guidelines, high calcium diet, regular exercise, and multivitamin daily.  Labs with PCP.   Lichen sclerosus - Recommend applying daily x 1-2 weeks then use twice weekly. She does not use Clobetasol often and is asymptomatic. Does not need refill at this time.   Encounter for surveillance of contraceptive pills - Plan: norethindrone-ethinyl estradiol (LOESTRIN) 1-20 MG-MCG tablet daily. Discussion regarding risk of continued use, especially with vaginal estrogen use. She has not been using the estrogen cream. She still has light monthly cycles. She would like to continue. Refill x 1 year provided.   Screening for cervical cancer - Normal Pap history.  Will repeat at 3-year interval per guidelines.  Screening for breast cancer - Normal mammogram history.  Overdue and is schedule in January. Normal breast exam today. Mother diagnosed with breast cancer at age 34.  Screening for colon cancer - Has  not had screening colonoscopy. Discussed current guidelines and importance of preventative screenings. Is interested in Cologuard and will check with insurance.   Return in 1 year for annual.   Tamela Gammon DNP, 10:18 AM 11/25/2020

## 2020-12-07 ENCOUNTER — Ambulatory Visit
Admission: EM | Admit: 2020-12-07 | Discharge: 2020-12-07 | Disposition: A | Discharge status: Home / Self Care | Payer: BC Managed Care – PPO | Attending: Family Medicine | Admitting: Family Medicine

## 2020-12-07 ENCOUNTER — Other Ambulatory Visit: Payer: Self-pay

## 2020-12-07 DIAGNOSIS — J3089 Other allergic rhinitis: Secondary | ICD-10-CM | POA: Diagnosis not present

## 2020-12-07 DIAGNOSIS — J0100 Acute maxillary sinusitis, unspecified: Secondary | ICD-10-CM

## 2020-12-07 MED ORDER — DEXAMETHASONE SODIUM PHOSPHATE 10 MG/ML IJ SOLN
10.0000 mg | Freq: Once | INTRAMUSCULAR | Status: AC
  Administered 2020-12-07: 12:00:00 10 mg via INTRAMUSCULAR

## 2020-12-07 MED ORDER — AZITHROMYCIN 250 MG PO TABS: ORAL_TABLET | ORAL | 0 refills | Status: DC

## 2020-12-07 NOTE — ED Triage Notes (Signed)
Patient states she had Covid about 3 weeks ago and it was very mild but started sneezing, face, teeth and ears are all hurting. Has a little congestion. Does not think she has been exposed to covid.She really does not think she has flu either.  Denies Fever Denies throat ache.

## 2020-12-11 NOTE — ED Provider Notes (Signed)
[facility]    CSN: [id number] Arrival date & time: 12/07/20  3235      History   Chief Complaint No chief complaint on file.   HPI Carol Estrada is a 55 y.o. female.   Patient presenting today with about 3 weeks of progressively worsening congestion, sneezing, facial pain, teeth hurting, ear pressure and popping, sinus headache.  States she had COVID about 3 weeks ago and feels it has progressed into a sinus infection.  She states most of the coughing, fever, body aches, fatigue have dissipated but the sinus symptoms have lingered and progressed.  She denies current fever, sore throat, cough, chest pain, shortness of breath, abdominal pain, nausea vomiting or diarrhea.  She does have a history of seasonal allergies for which she takes an antihistamine daily.  She is also been taking Mucinex and cold and sinus medications with minimal relief.  No new sick contacts recently.   Past Medical History:  Diagnosis Date   Eczema    Elevated cholesterol    Endometriosis    H/O cold sores    Infertility    IVF   Lichen sclerosus    Perennial allergic rhinitis    Reflux    Thyroid disease    Hypothyroid    Patient Active Problem List   Diagnosis Date Noted   Abdominal discomfort, generalized 09/23/2019   Dysuria 09/23/2019   Lower leg edema 08/27/2019   Hyperlipidemia LDL goal <130 07/06/2012   Hypothyroidism     Past Surgical History:  Procedure Laterality Date   CESAREAN SECTION     PELVIC LAPAROSCOPY     REFRACTIVE SURGERY     TONSILLECTOMY AND ADENOIDECTOMY      OB History     Gravida  1   Para  1   Term  1   Preterm      AB      Living  1      SAB      IAB      Ectopic      Multiple      Live Births               Home Medications    Prior to Admission medications   Medication Sig Start Date End Date Taking? Authorizing Provider  azithromycin (ZITHROMAX) 250 MG tablet Take first 2 tablets together, then 1 every day until  finished. 12/07/20  Yes Volney American, PA-C  acyclovir (ZOVIRAX) 400 MG tablet Take one tab po bid prn cold sore outbreak. 06/26/17   Fontaine, Belinda Block, MD  clobetasol cream (TEMOVATE) 5.73 % Apply 1 application topically 2 (two) times daily. Put on file 03/12/17   Mikey Kirschner, MD  estradiol (ESTRACE VAGINAL) 0.1 MG/GM vaginal cream Place 1 Applicatorful vaginally at bedtime. Place one gram vaginally nightly x 2 weeks then every other night x 2 weeks, then 2 nights per week. Patient not taking: Reported on 11/25/2020 11/03/19   Joseph Pierini, MD  fluticasone [facility]) 50 MCG/ACT nasal spray USE 2 SPRAYS IN EACH NOSTRIL ONCE DAILY. 08/11/14   Mikey Kirschner, MD  hydrocortisone 2.5 % cream APPLY TO AFFECTED AREA TWICE DAILY. 09/17/16   Mikey Kirschner, MD  levothyroxine (SYNTHROID) 100 MCG tablet Take 1 tablet (100 mcg total) by mouth daily. 08/31/20   Elvia Collum M, DO  Loratadine (CLARITIN PO) Take by mouth.    [provider]  norethindrone-ethinyl estradiol (LOESTRIN) 1-20 MG-MCG tablet Take 1 tablet by mouth daily.  11/25/20   Marny Lowenstein A, NP  pravastatin (PRAVACHOL) 40 MG tablet TAKE (1) TABLET BY MOUTH DAILY. 08/31/20   Lovena Le, Malena M, DO  triamterene-hydrochlorothiazide (MAXZIDE-25) 37.5-25 MG tablet TAKE 1 TABLET BY MOUTH EVERY MORNING AS NEEDED FOR SWELLING. Patient not taking: Reported on 11/25/2020 08/31/20   Erven Colla, DO    Family History Family History  Problem Relation Age of Onset   Breast cancer Mother        Age 40   Thyroid disease Mother    Hypertension Father    Hyperlipidemia Father    Heart disease Maternal Grandfather    Other Paternal Grandmother        Brain tumor   Heart disease Paternal Grandfather     Social History Social History   Tobacco Use   Smoking status: Never   Smokeless tobacco: Never  Vaping Use   Vaping Use: Never used  Substance Use Topics   Alcohol use: Yes    Alcohol/week: 2.0 standard drinks     Types: 2 Standard drinks or equivalent per week   Drug use: No     Allergies   Ceftin and Penicillins   Review of Systems Review of Systems Per HPI  Physical Exam Triage Vital Signs ED Triage Vitals  Enc Vitals Group     BP 12/07/20 1120 (!) 130/91     Pulse Rate 12/07/20 1120 96     Resp 12/07/20 1120 18     Temp 12/07/20 1120 98.8 F (37.1 C)     Temp Source 12/07/20 1120 Oral     SpO2 12/07/20 1120 98 %     Weight --      Height --      Head Circumference --      Peak Flow --      Pain Score 12/07/20 1118 7     Pain Loc --      Pain Edu? --      Excl. in [place]? --    No data found.  Updated Vital Signs BP (!) 130/91 (BP Location: Right Arm)   Pulse 96   Temp 98.8 F (37.1 C) (Oral)   Resp 18   LMP 11/23/2020 (Approximate)   SpO2 98%   Visual Acuity Right Eye Distance:   Left Eye Distance:   Bilateral Distance:    Right Eye Near:   Left Eye Near:    Bilateral Near:     Physical Exam Vitals and nursing note reviewed.  Constitutional:      Appearance: Normal appearance. She is not ill-appearing.  HENT:     Head: Atraumatic.     Right Ear: Tympanic membrane normal.     Left Ear: Tympanic membrane normal.     Nose: Congestion present.     Comments: Bilateral maxillary sinuses tender to palpation    Mouth/Throat:     Mouth: Mucous membranes are moist.     Pharynx: Oropharynx is clear. No posterior oropharyngeal erythema.  Eyes:     Extraocular Movements: Extraocular movements intact.     Conjunctiva/sclera: Conjunctivae normal.  Cardiovascular:     Rate and Rhythm: Normal rate and regular rhythm.     Heart sounds: Normal heart sounds.  Pulmonary:     Effort: Pulmonary effort is normal.     Breath sounds: Normal breath sounds. No wheezing or rales.  Abdominal:     General: Bowel sounds are normal. There is no distension.     Palpations: Abdomen is soft.  Tenderness: There is no abdominal tenderness. There is no guarding.  Musculoskeletal:         General: Normal range of motion.     Cervical back: Normal range of motion and neck supple.  Skin:    General: Skin is warm and dry.  Neurological:     Mental Status: She is alert and oriented to person, place, and time.  Psychiatric:        Mood and Affect: Mood normal.        Thought Content: Thought content normal.        Judgment: Judgment normal.     UC Treatments / Results  Labs (all labs ordered are listed, but only abnormal results are displayed) Labs Reviewed - No data to display  EKG   Radiology No results found.  Procedures Procedures (including critical care time)  Medications Ordered in UC Medications  dexamethasone (DECADRON) injection 10 mg (10 mg Intramuscular Given 12/07/20 1200)    Initial Impression / Assessment and Plan / UC Course  I have reviewed the triage vital signs and the nursing notes.  Pertinent labs & imaging results that were available during my care of the patient were reviewed by me and considered in my medical decision making (see chart for details).     Suspect largely allergic but possibly progressing at this point into a bacterial sinus infection.  Will give IM Decadron, continue antihistamine, add Flonase twice daily, sinus rinses.  Azithromycin sent to pharmacy to cover for bacterial infection.  Discussed return precautions for worsening symptoms. Final Clinical Impressions(s) / UC Diagnoses   Final diagnoses:  Seasonal allergic rhinitis due to other allergic trigger  Acute maxillary sinusitis, recurrence not specified   Discharge Instructions   None    ED Prescriptions     Medication Sig Dispense Auth. Provider   azithromycin (ZITHROMAX) 250 MG tablet Take first 2 tablets together, then 1 every day until finished. 6 tablet Volney American, [place]      PDMP not reviewed this encounter.   Merrie Roof [place], [place] 12/11/20 [phone]

## 2020-12-13 ENCOUNTER — Other Ambulatory Visit: Payer: Self-pay | Admitting: Nurse Practitioner

## 2020-12-13 ENCOUNTER — Encounter: Payer: Self-pay | Admitting: Nurse Practitioner

## 2020-12-13 DIAGNOSIS — L900 Lichen sclerosus et atrophicus: Secondary | ICD-10-CM

## 2020-12-13 DIAGNOSIS — B009 Herpesviral infection, unspecified: Secondary | ICD-10-CM

## 2020-12-13 MED ORDER — CLOBETASOL PROPIONATE 0.05 % EX CREA: 1.0000 "application " | TOPICAL_CREAM | Freq: Two times a day (BID) | CUTANEOUS | 2 refills | Status: DC

## 2020-12-13 MED ORDER — ACYCLOVIR 400 MG PO TABS: ORAL_TABLET | ORAL | 3 refills | Status: DC

## 2020-12-27 ENCOUNTER — Ambulatory Visit: Payer: BC Managed Care – PPO | Admitting: Nurse Practitioner

## 2021-02-01 ENCOUNTER — Other Ambulatory Visit: Payer: Self-pay | Admitting: Family Medicine

## 2021-02-01 DIAGNOSIS — Z1231 Encounter for screening mammogram for malignant neoplasm of breast: Secondary | ICD-10-CM

## 2021-02-16 ENCOUNTER — Encounter: Payer: Self-pay | Admitting: Family Medicine

## 2021-02-16 DIAGNOSIS — Z79899 Other long term (current) drug therapy: Secondary | ICD-10-CM

## 2021-02-16 DIAGNOSIS — E039 Hypothyroidism, unspecified: Secondary | ICD-10-CM

## 2021-02-16 DIAGNOSIS — E785 Hyperlipidemia, unspecified: Secondary | ICD-10-CM

## 2021-02-27 DIAGNOSIS — E785 Hyperlipidemia, unspecified: Secondary | ICD-10-CM | POA: Diagnosis not present

## 2021-02-27 DIAGNOSIS — E039 Hypothyroidism, unspecified: Secondary | ICD-10-CM | POA: Diagnosis not present

## 2021-02-27 DIAGNOSIS — Z79899 Other long term (current) drug therapy: Secondary | ICD-10-CM | POA: Diagnosis not present

## 2021-02-28 LAB — CBC WITH DIFFERENTIAL/PLATELET
Basophils Absolute: 0.1 10*3/uL (ref 0.0–0.2)
Basos: 1 %
EOS (ABSOLUTE): 0.1 10*3/uL (ref 0.0–0.4)
Eos: 1 %
Hematocrit: 46.3 % (ref 34.0–46.6)
Hemoglobin: 15.7 g/dL (ref 11.1–15.9)
Immature Grans (Abs): 0 10*3/uL (ref 0.0–0.1)
Immature Granulocytes: 0 %
Lymphocytes Absolute: 3.3 10*3/uL — ABNORMAL HIGH (ref 0.7–3.1)
Lymphs: 43 %
MCH: 32.6 pg (ref 26.6–33.0)
MCHC: 33.9 g/dL (ref 31.5–35.7)
MCV: 96 fL (ref 79–97)
Monocytes Absolute: 0.7 10*3/uL (ref 0.1–0.9)
Monocytes: 9 %
Neutrophils Absolute: 3.6 10*3/uL (ref 1.4–7.0)
Neutrophils: 46 %
Platelets: 285 10*3/uL (ref 150–450)
RBC: 4.81 x10E6/uL (ref 3.77–5.28)
RDW: 12 % (ref 11.7–15.4)
WBC: 7.8 10*3/uL (ref 3.4–10.8)

## 2021-02-28 LAB — LIPID PANEL
Chol/HDL Ratio: 3.3 ratio (ref 0.0–4.4)
Cholesterol, Total: 198 mg/dL (ref 100–199)
HDL: 60 mg/dL (ref >39)
LDL Chol Calc (NIH): 118 mg/dL — ABNORMAL HIGH (ref 0–99)
Triglycerides: 114 mg/dL (ref 0–149)
VLDL Cholesterol Cal: 20 mg/dL (ref 5–40)

## 2021-02-28 LAB — CMP14+EGFR
ALT: 16 IU/L (ref 0–32)
AST: 16 IU/L (ref 0–40)
Albumin/Globulin Ratio: 1.9 (ref 1.2–2.2)
Albumin: 4.2 g/dL (ref 3.8–4.9)
Alkaline Phosphatase: 70 IU/L (ref 44–121)
BUN/Creatinine Ratio: 11 (ref 9–23)
BUN: 9 mg/dL (ref 6–24)
Bilirubin Total: 0.4 mg/dL (ref 0.0–1.2)
CO2: 22 mmol/L (ref 20–29)
Calcium: 9.2 mg/dL (ref 8.7–10.2)
Chloride: 104 mmol/L (ref 96–106)
Creatinine, Ser: 0.79 mg/dL (ref 0.57–1.00)
Globulin, Total: 2.2 g/dL (ref 1.5–4.5)
Glucose: 81 mg/dL (ref 70–99)
Potassium: 4.3 mmol/L (ref 3.5–5.2)
Sodium: 137 mmol/L (ref 134–144)
Total Protein: 6.4 g/dL (ref 6.0–8.5)
eGFR: 89 mL/min/{1.73_m2} (ref >59)

## 2021-02-28 LAB — TSH: TSH: 3.18 u[IU]/mL (ref 0.450–4.500)

## 2021-03-06 ENCOUNTER — Other Ambulatory Visit: Payer: Self-pay

## 2021-03-06 ENCOUNTER — Ambulatory Visit: Payer: BC Managed Care – PPO | Admitting: Family Medicine

## 2021-03-06 DIAGNOSIS — E039 Hypothyroidism, unspecified: Secondary | ICD-10-CM

## 2021-03-06 DIAGNOSIS — E785 Hyperlipidemia, unspecified: Secondary | ICD-10-CM

## 2021-03-06 DIAGNOSIS — I10 Essential (primary) hypertension: Secondary | ICD-10-CM | POA: Diagnosis not present

## 2021-03-06 DIAGNOSIS — L900 Lichen sclerosus et atrophicus: Secondary | ICD-10-CM | POA: Diagnosis not present

## 2021-03-06 MED ORDER — LEVOTHYROXINE SODIUM 100 MCG PO TABS: 100.0000 ug | ORAL_TABLET | Freq: Every day | ORAL | 1 refills | Status: DC

## 2021-03-06 MED ORDER — PRAVASTATIN SODIUM 80 MG PO TABS: 80.0000 mg | ORAL_TABLET | Freq: Every day | ORAL | 3 refills | Status: DC

## 2021-03-06 MED ORDER — TRIAMTERENE-HCTZ 37.5-25 MG PO TABS: ORAL_TABLET | ORAL | 1 refills | Status: DC

## 2021-03-06 NOTE — Patient Instructions (Signed)
Follow up in 6 months to 1 year.  Be sure to get your mammogram.  Consider Colonoscopy.  Take care  Dr. Lacinda Axon

## 2021-03-06 NOTE — Progress Notes (Signed)
Subjective:  Patient ID: Carol Estrada, female    DOB: [birth year]  Age: 56 y.o. MRN: [id number]  CC: Chief Complaint  Patient presents with   Hypothyroidism   Hyperlipidemia    HPI:  56 year old female with hypothyroidism, hyperlipidemia, lichen sclerosus presents for follow-up.  Hypothyroidism is stable.  She is feeling well.  TSH is within normal limits.  She is on 100 mcg of levothyroxine and doing well.  Patient has hyperlipidemia.  LDL has still been elevated despite pravastatin 40 mg daily.  Will discuss increasing dose.  Patient states that she is feeling well.  No chest pain or shortness of breath.  She takes triamterene HCTZ sparingly for edema.  Patient states that she needs medication refills.  Patient Active Problem List   Diagnosis Date Noted   Lichen sclerosus 10/93/2355   Hyperlipidemia 07/06/2012   Hypothyroidism     Social Hx   Social History   Socioeconomic History   Marital status: Married    Spouse name: Not on file   Number of children: Not on file   Years of education: Not on file   Highest education level: Not on file  Occupational History   Not on file  Tobacco Use   Smoking status: Never   Smokeless tobacco: Never  Vaping Use   Vaping Use: Never used  Substance and Sexual Activity   Alcohol use: Yes    Alcohol/week: 2.0 standard drinks    Types: 2 Standard drinks or equivalent per week   Drug use: No   Sexual activity: Yes    Birth control/protection: Pill    Comment: 1st intercourse 56 yo-Fewer than 5 partners  Other Topics Concern   Not on file  Social History Narrative   Not on file   Social Determinants of Health   Financial Resource Strain: Not on file  Food Insecurity: Not on file  Transportation Needs: Not on file  Physical Activity: Not on file  Stress: Not on file  Social Connections: Not on file    Review of Systems Per HPI  Objective:  BP 120/84    Pulse 89    Temp 98.8 F (37.1 C) (Oral)    Ht 5\' 3"  (1.6  m)    Wt 171 lb 9.6 oz (77.8 kg)    SpO2 99%    BMI 30.40 kg/m   BP/Weight 03/06/2021 12/07/2020 73/22/0254  Systolic BP 270 623 762  Diastolic BP 84 91 74  Wt. (Lbs) 171.6 - 169  BMI 30.4 - 29.94    Physical Exam Vitals and nursing note reviewed.  Constitutional:      General: She is not in acute distress.    Appearance: Normal appearance. She is not ill-appearing.  Eyes:     General:        Right eye: No discharge.        Left eye: No discharge.     Conjunctiva/sclera: Conjunctivae normal.  Cardiovascular:     Rate and Rhythm: Normal rate and regular rhythm.  Pulmonary:     Effort: Pulmonary effort is normal.     Breath sounds: Normal breath sounds. No wheezing, rhonchi or rales.  Neurological:     Mental Status: She is alert.  Psychiatric:        Mood and Affect: Mood normal.        Behavior: Behavior normal.    Lab Results  Component Value Date   WBC 7.8 02/27/2021   HGB 15.7 02/27/2021   HCT 46.3 02/27/2021  PLT 285 02/27/2021   GLUCOSE 81 02/27/2021   CHOL 198 02/27/2021   TRIG 114 02/27/2021   HDL 60 02/27/2021   LDLCALC 118 (H) 02/27/2021   ALT 16 02/27/2021   AST 16 02/27/2021   NA 137 02/27/2021   K 4.3 02/27/2021   CL 104 02/27/2021   CREATININE 0.79 02/27/2021   BUN 9 02/27/2021   CO2 22 02/27/2021   TSH 3.180 02/27/2021     Assessment & Plan:   Problem List Items Addressed This Visit       Cardiovascular and Mediastinum   RESOLVED: Essential hypertension   Relevant Medications   triamterene-hydrochlorothiazide (MAXZIDE-25) 37.5-25 MG tablet   pravastatin (PRAVACHOL) 80 MG tablet     Endocrine   Hypothyroidism    Stable.  Continue Synthroid.      Relevant Medications   levothyroxine (SYNTHROID) 100 MCG tablet     Other   Hyperlipidemia    LDL elevated at 118.  Increasing pravastatin to 80 mg daily.      Relevant Medications   triamterene-hydrochlorothiazide (MAXZIDE-25) 37.5-25 MG tablet   pravastatin (PRAVACHOL) 80 MG tablet    Lichen sclerosus    Meds ordered this encounter  Medications   triamterene-hydrochlorothiazide (MAXZIDE-25) 37.5-25 MG tablet    Sig: TAKE 1 TABLET BY MOUTH EVERY MORNING AS NEEDED FOR SWELLING.    Dispense:  90 tablet    Refill:  1   pravastatin (PRAVACHOL) 80 MG tablet    Sig: Take 1 tablet (80 mg total) by mouth daily.    Dispense:  90 tablet    Refill:  3   levothyroxine (SYNTHROID) 100 MCG tablet    Sig: Take 1 tablet (100 mcg total) by mouth daily.    Dispense:  90 tablet    Refill:  1    Follow-up: 6 months to 1 year.  [place]

## 2021-03-06 NOTE — Assessment & Plan Note (Signed)
Stable Continue Synthroid 

## 2021-03-06 NOTE — Assessment & Plan Note (Signed)
LDL elevated at 118.  Increasing pravastatin to 80 mg daily.

## 2021-03-07 ENCOUNTER — Other Ambulatory Visit: Payer: Self-pay | Admitting: Family Medicine

## 2021-03-14 ENCOUNTER — Ambulatory Visit: Payer: BC Managed Care – PPO

## 2021-04-04 ENCOUNTER — Ambulatory Visit
Admission: RE | Admit: 2021-04-04 | Discharge: 2021-04-04 | Disposition: A | Discharge status: Home / Self Care | Payer: BC Managed Care – PPO | Source: Ambulatory Visit | Attending: Family Medicine | Admitting: Family Medicine

## 2021-04-04 ENCOUNTER — Other Ambulatory Visit: Payer: Self-pay | Admitting: Nurse Practitioner

## 2021-04-04 ENCOUNTER — Ambulatory Visit: Payer: BC Managed Care – PPO

## 2021-04-04 DIAGNOSIS — Z1231 Encounter for screening mammogram for malignant neoplasm of breast: Secondary | ICD-10-CM

## 2021-04-14 IMAGING — MG DIGITAL SCREENING BILAT W/ CAD
4 series · 4 of 4 positions shown · non-contrast
Comparison: Previous exam(s).

CLINICAL DATA: Screening.

EXAM:
DIGITAL SCREENING BILATERAL MAMMOGRAM WITH CAD

[R MLO]
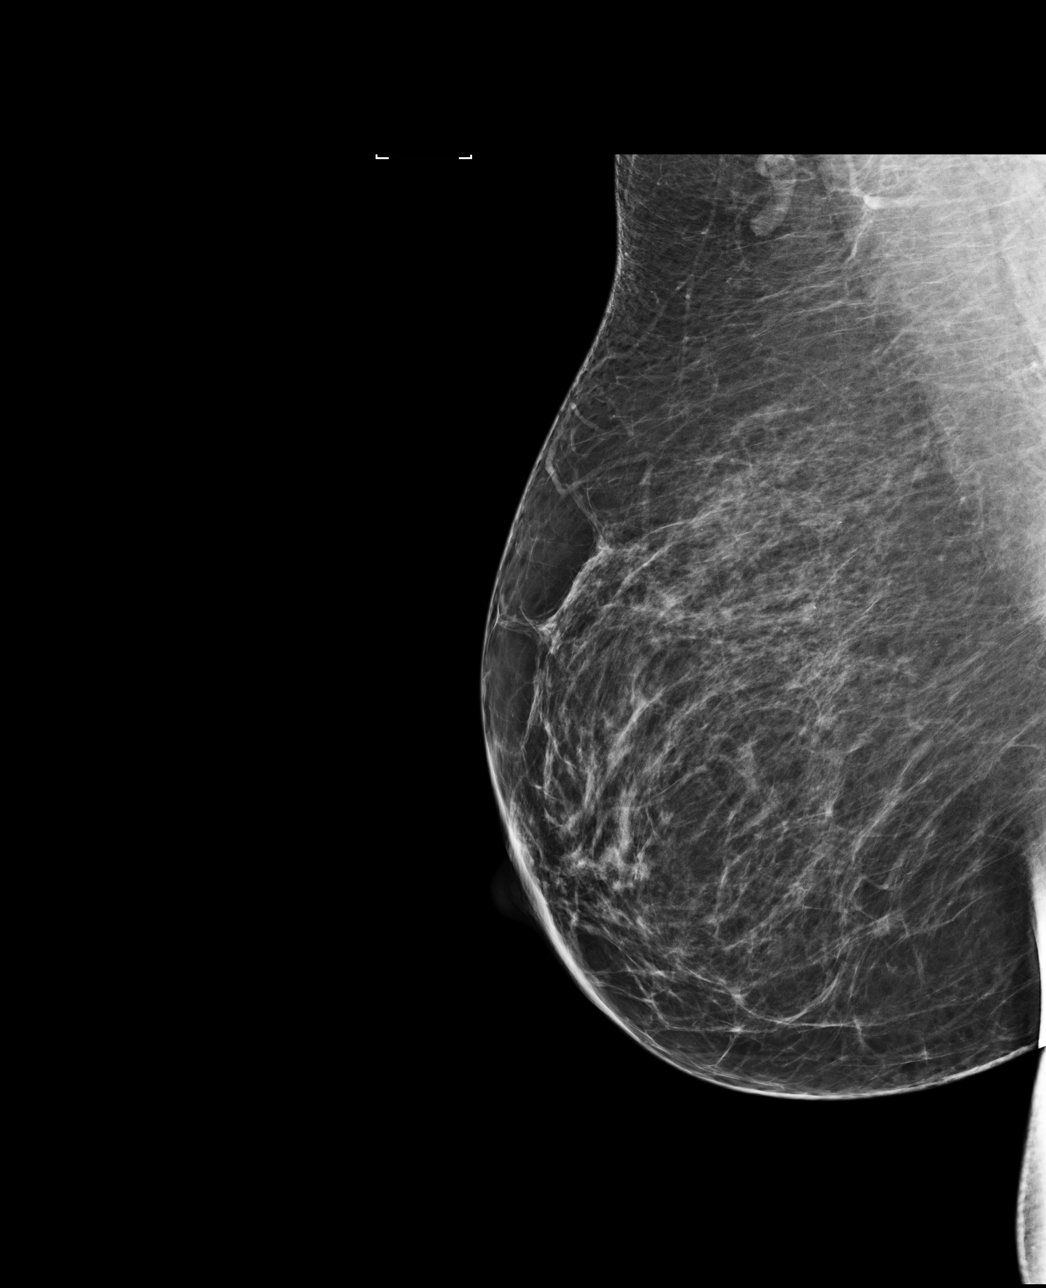

[R CC]
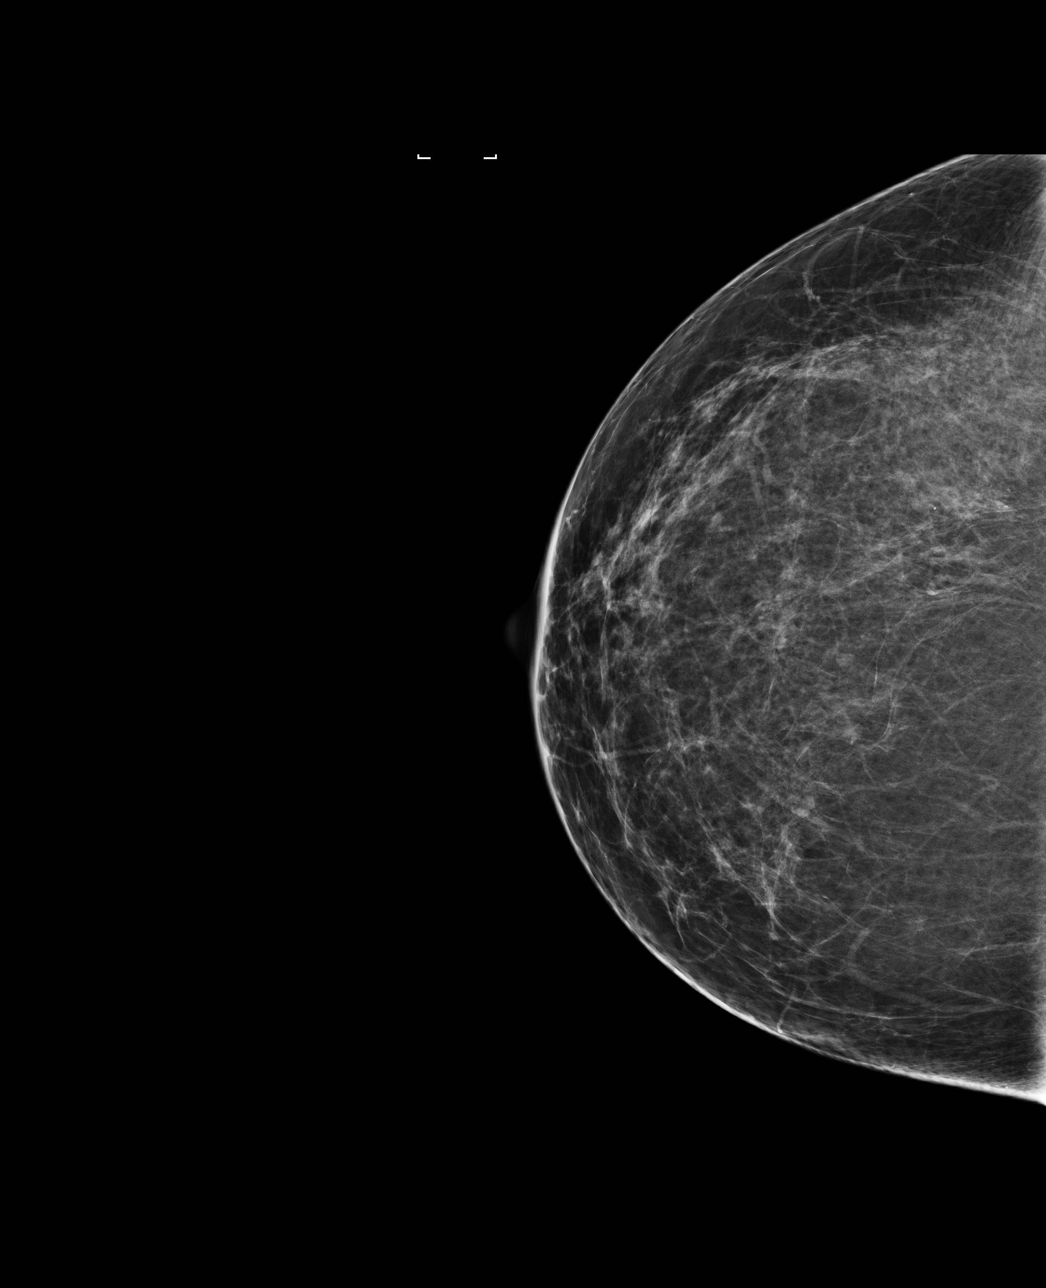

[L CC]
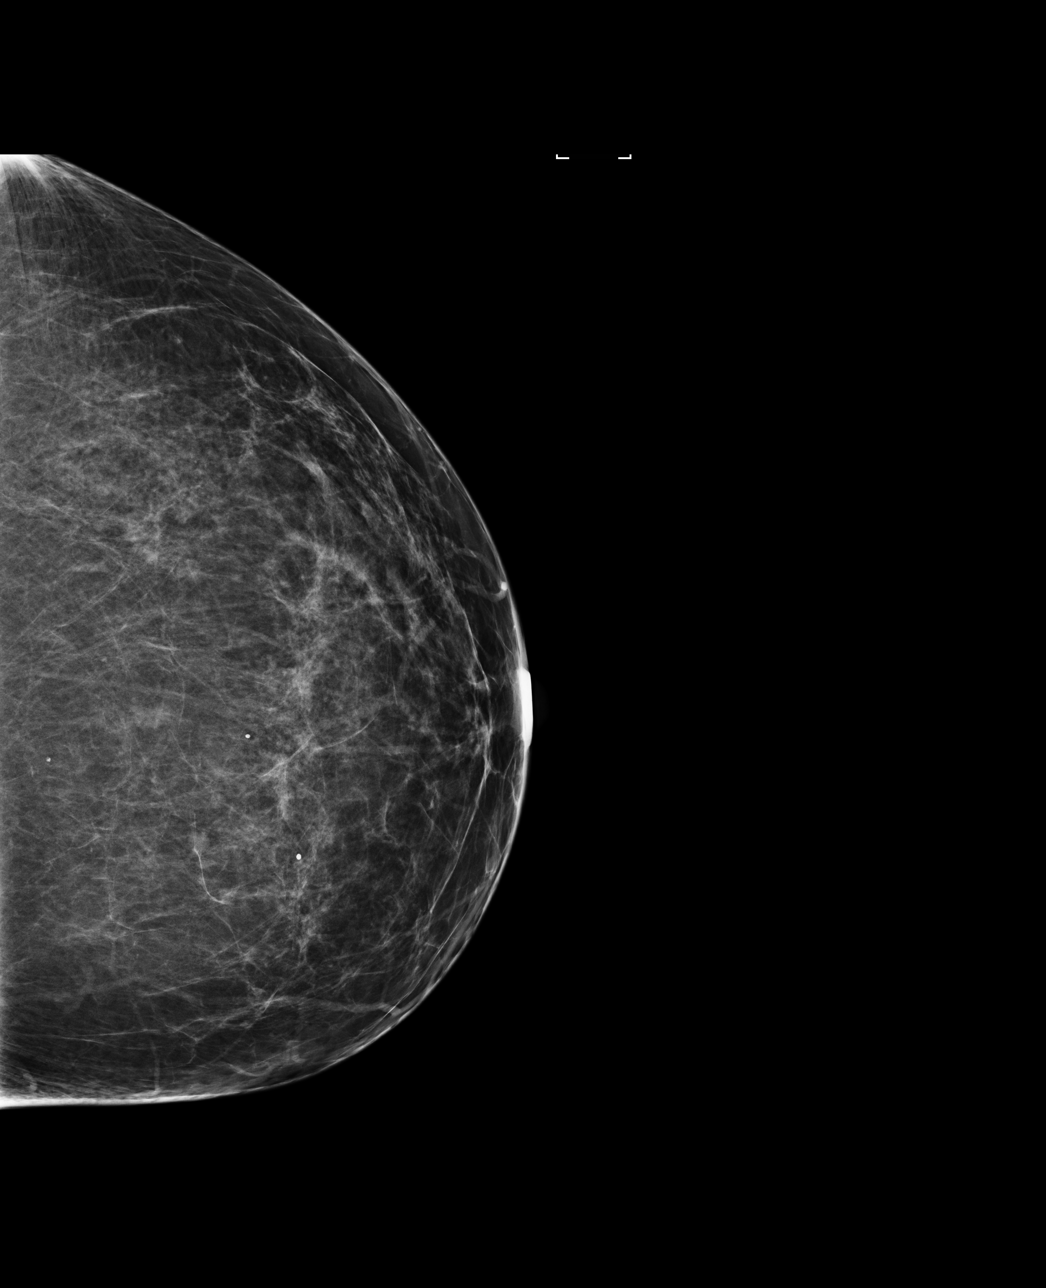

[L MLO]
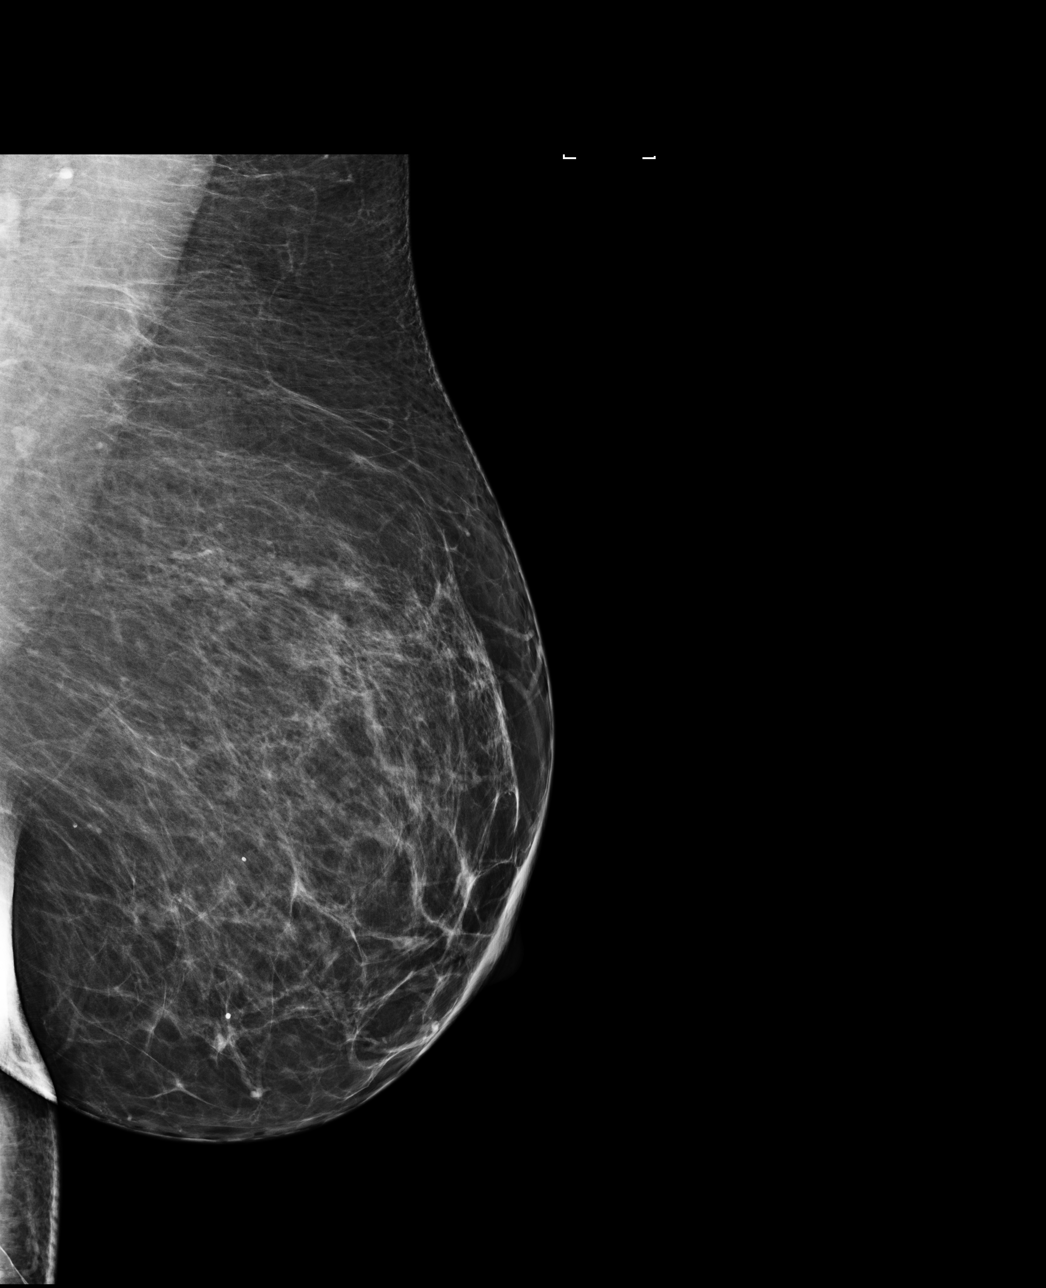

[4 of 4 positions shown; findings below may reference images not displayed]

ACR Breast Density Category b: There are scattered areas of
fibroglandular density.
FINDINGS: There are no findings suspicious for malignancy. Images were
processed with CAD.
IMPRESSION: No mammographic evidence of malignancy. A result letter of this
screening mammogram will be mailed directly to the patient.

RECOMMENDATION:
Screening mammogram in one year. (Code:AS-G-LCT)

BI-RADS CATEGORY  1: Negative.

## 2021-05-02 ENCOUNTER — Other Ambulatory Visit: Payer: Self-pay | Admitting: Nurse Practitioner

## 2021-05-05 ENCOUNTER — Encounter: Payer: Self-pay | Admitting: Nurse Practitioner

## 2021-05-08 ENCOUNTER — Other Ambulatory Visit: Payer: Self-pay | Admitting: Nurse Practitioner

## 2021-05-08 DIAGNOSIS — N952 Postmenopausal atrophic vaginitis: Secondary | ICD-10-CM

## 2021-05-08 MED ORDER — ESTRADIOL 0.1 MG/GM VA CREA: 1.0000 g | TOPICAL_CREAM | Freq: Every day | VAGINAL | 2 refills | Status: DC

## 2021-07-18 ENCOUNTER — Other Ambulatory Visit: Payer: Self-pay | Admitting: Family Medicine

## 2021-08-14 ENCOUNTER — Encounter: Payer: Self-pay | Admitting: Family Medicine

## 2021-08-14 DIAGNOSIS — E785 Hyperlipidemia, unspecified: Secondary | ICD-10-CM

## 2021-08-14 DIAGNOSIS — E039 Hypothyroidism, unspecified: Secondary | ICD-10-CM

## 2021-08-14 DIAGNOSIS — I10 Essential (primary) hypertension: Secondary | ICD-10-CM

## 2021-08-14 DIAGNOSIS — Z79899 Other long term (current) drug therapy: Secondary | ICD-10-CM

## 2021-08-14 NOTE — Telephone Encounter (Signed)
Patients last labs were TSH,lipid,CMP14+EGFR and CBC done on 02/27/21.

## 2021-09-01 DIAGNOSIS — E785 Hyperlipidemia, unspecified: Secondary | ICD-10-CM | POA: Diagnosis not present

## 2021-09-01 DIAGNOSIS — I10 Essential (primary) hypertension: Secondary | ICD-10-CM | POA: Diagnosis not present

## 2021-09-01 DIAGNOSIS — E039 Hypothyroidism, unspecified: Secondary | ICD-10-CM | POA: Diagnosis not present

## 2021-09-01 DIAGNOSIS — Z79899 Other long term (current) drug therapy: Secondary | ICD-10-CM | POA: Diagnosis not present

## 2021-09-02 LAB — LIPID PANEL
Chol/HDL Ratio: 3.7 ratio (ref 0.0–4.4)
Cholesterol, Total: 191 mg/dL (ref 100–199)
HDL: 51 mg/dL (ref >39)
LDL Chol Calc (NIH): 113 mg/dL — ABNORMAL HIGH (ref 0–99)
Triglycerides: 154 mg/dL — ABNORMAL HIGH (ref 0–149)
VLDL Cholesterol Cal: 27 mg/dL (ref 5–40)

## 2021-09-02 LAB — CBC WITH DIFFERENTIAL/PLATELET
Basophils Absolute: 0.1 10*3/uL (ref 0.0–0.2)
Basos: 1 %
EOS (ABSOLUTE): 0.1 10*3/uL (ref 0.0–0.4)
Eos: 1 %
Hematocrit: 46.8 % — ABNORMAL HIGH (ref 34.0–46.6)
Hemoglobin: 15.9 g/dL (ref 11.1–15.9)
Immature Grans (Abs): 0 10*3/uL (ref 0.0–0.1)
Immature Granulocytes: 0 %
Lymphocytes Absolute: 2.8 10*3/uL (ref 0.7–3.1)
Lymphs: 38 %
MCH: 32.4 pg (ref 26.6–33.0)
MCHC: 34 g/dL (ref 31.5–35.7)
MCV: 96 fL (ref 79–97)
Monocytes Absolute: 0.7 10*3/uL (ref 0.1–0.9)
Monocytes: 9 %
Neutrophils Absolute: 3.8 10*3/uL (ref 1.4–7.0)
Neutrophils: 51 %
Platelets: 271 10*3/uL (ref 150–450)
RBC: 4.9 x10E6/uL (ref 3.77–5.28)
RDW: 11.8 % (ref 11.7–15.4)
WBC: 7.4 10*3/uL (ref 3.4–10.8)

## 2021-09-02 LAB — CMP14+EGFR
ALT: 18 IU/L (ref 0–32)
AST: 18 IU/L (ref 0–40)
Albumin/Globulin Ratio: 1.8 (ref 1.2–2.2)
Albumin: 4.2 g/dL (ref 3.8–4.9)
Alkaline Phosphatase: 71 IU/L (ref 44–121)
BUN/Creatinine Ratio: 11 (ref 9–23)
BUN: 9 mg/dL (ref 6–24)
Bilirubin Total: 0.4 mg/dL (ref 0.0–1.2)
CO2: 19 mmol/L — ABNORMAL LOW (ref 20–29)
Calcium: 9.1 mg/dL (ref 8.7–10.2)
Chloride: 97 mmol/L (ref 96–106)
Creatinine, Ser: 0.79 mg/dL (ref 0.57–1.00)
Globulin, Total: 2.4 g/dL (ref 1.5–4.5)
Glucose: 80 mg/dL (ref 70–99)
Potassium: 4 mmol/L (ref 3.5–5.2)
Sodium: 136 mmol/L (ref 134–144)
Total Protein: 6.6 g/dL (ref 6.0–8.5)
eGFR: 89 mL/min/{1.73_m2} (ref >59)

## 2021-09-02 LAB — TSH: TSH: 0.562 u[IU]/mL (ref 0.450–4.500)

## 2021-09-19 ENCOUNTER — Ambulatory Visit: Payer: BC Managed Care – PPO | Admitting: Family Medicine

## 2021-09-19 ENCOUNTER — Encounter: Payer: Self-pay | Admitting: Family Medicine

## 2021-09-19 DIAGNOSIS — E039 Hypothyroidism, unspecified: Secondary | ICD-10-CM

## 2021-09-19 DIAGNOSIS — E785 Hyperlipidemia, unspecified: Secondary | ICD-10-CM | POA: Diagnosis not present

## 2021-09-19 NOTE — Assessment & Plan Note (Signed)
Patient reluctant to switch statin.  Continue pravastatin 80 mg daily.  Work on continued lifestyle changes.

## 2021-09-19 NOTE — Patient Instructions (Signed)
Continue your medications.  Follow up annually.  Consider colonoscopy/cologuard.   Take care  Dr. Lacinda Axon

## 2021-09-19 NOTE — Progress Notes (Signed)
Subjective:  Patient ID: Carol Estrada, female    DOB: 12-24-1965  Age: 56 y.o. MRN: [id number]  CC: Chief Complaint  Patient presents with   Hypothyroidism    No issues/concerns today.     HPI:  56 year old female with hypothyroidism, hyperlipidemia, lichen sclerosus presents for follow-up.  Patient's LDL 113.  She is compliant with pravastatin.  I would prefer better control but patient is reluctant to switch statin therapy.  She states that she will try to work on diet and lifestyle changes.  Patient's hypothyroidism is stable.  She is compliant with her medication.  Most recent TSH 0.562.  She states that overall she is doing well.  She has no new complaints or concerns at this time.  Patient Active Problem List   Diagnosis Date Noted   Lichen sclerosus 27/03/5007   Hyperlipidemia 07/06/2012   Hypothyroidism     Social Hx   Social History   Socioeconomic History   Marital status: Married    Spouse name: Not on file   Number of children: Not on file   Years of education: Not on file   Highest education level: Not on file  Occupational History   Not on file  Tobacco Use   Smoking status: Never   Smokeless tobacco: Never  Vaping Use   Vaping Use: Never used  Substance and Sexual Activity   Alcohol use: Yes    Alcohol/week: 2.0 standard drinks of alcohol    Types: 2 Standard drinks or equivalent per week   Drug use: No   Sexual activity: Yes    Birth control/protection: Pill    Comment: 1st intercourse 56 yo-Fewer than 5 partners  Other Topics Concern   Not on file  Social History Narrative   Not on file   Social Determinants of Health   Financial Resource Strain: Not on file  Food Insecurity: Not on file  Transportation Needs: Not on file  Physical Activity: Not on file  Stress: Not on file  Social Connections: Not on file    Review of Systems  Constitutional: Negative.   Respiratory: Negative.    Cardiovascular: Negative.    Objective:  BP  129/83   Pulse 64   Temp (!) 97.3 F (36.3 C)   Wt 167 lb 12.8 oz (76.1 kg)   SpO2 99%   BMI 29.72 kg/m      09/19/2021    8:34 AM 03/06/2021    9:16 AM 03/06/2021    8:43 AM  BP/Weight  Systolic BP 381 829 937  Diastolic BP 83 84 86  Wt. (Lbs) 167.8  171.6  BMI 29.72 kg/m2  30.4 kg/m2   Physical Exam Vitals and nursing note reviewed.  Constitutional:      General: She is not in acute distress.    Appearance: Normal appearance. She is not ill-appearing.  HENT:     Head: Normocephalic and atraumatic.  Cardiovascular:     Rate and Rhythm: Normal rate and regular rhythm.  Pulmonary:     Effort: Pulmonary effort is normal.     Breath sounds: Normal breath sounds. No wheezing, rhonchi or rales.  Abdominal:     General: There is no distension.     Palpations: Abdomen is soft.     Tenderness: There is no abdominal tenderness.  Neurological:     Mental Status: She is alert.  Psychiatric:        Mood and Affect: Mood normal.        Behavior: Behavior  normal.    Lab Results  Component Value Date   WBC 7.4 09/01/2021   HGB 15.9 09/01/2021   HCT 46.8 (H) 09/01/2021   PLT 271 09/01/2021   GLUCOSE 80 09/01/2021   CHOL 191 09/01/2021   TRIG 154 (H) 09/01/2021   HDL 51 09/01/2021   LDLCALC 113 (H) 09/01/2021   ALT 18 09/01/2021   AST 18 09/01/2021   NA 136 09/01/2021   K 4.0 09/01/2021   CL 97 09/01/2021   CREATININE 0.79 09/01/2021   BUN 9 09/01/2021   CO2 19 (L) 09/01/2021   TSH 0.562 09/01/2021     Assessment & Plan:   Problem List Items Addressed This Visit       Endocrine   Hypothyroidism    Stable.  Continue current dosing of Synthroid.        Other   Hyperlipidemia    Patient reluctant to switch statin.  Continue pravastatin 80 mg daily.  Work on continued lifestyle changes.       Follow-up:  Return in about 1 year (around 09/20/2022).  [place]

## 2021-09-19 NOTE — Assessment & Plan Note (Signed)
Stable.  Continue current dosing of Synthroid. 

## 2021-09-26 ENCOUNTER — Ambulatory Visit: Payer: BC Managed Care – PPO | Admitting: Family Medicine

## 2021-09-26 DIAGNOSIS — H8110 Benign paroxysmal vertigo, unspecified ear: Secondary | ICD-10-CM | POA: Diagnosis not present

## 2021-09-27 DIAGNOSIS — H8110 Benign paroxysmal vertigo, unspecified ear: Secondary | ICD-10-CM | POA: Insufficient documentation

## 2021-09-27 NOTE — Progress Notes (Signed)
Subjective:  Patient ID: Carol Estrada, female    DOB: Dec 08, 1965  Age: 56 y.o. MRN: [id number]  CC: Chief Complaint  Patient presents with   Dizziness    Started this past sat/ sun waking up around 2 am with room spinning and flashes Taking otc meclizine and sleeping propped up on couch , recent beach trip     HPI:  56 year old female presents for evaluation of the above.  Symptoms started on Sat. Has felt dizzy. Describes it as room spinning. Has been taking Meclizine with improvement. Recent stress. Has felt flushed/sweaty when it occurs.   Patient Active Problem List   Diagnosis Date Noted   BPPV (benign paroxysmal positional vertigo) 85/63/1497   Lichen sclerosus 02/63/7858   Hyperlipidemia 07/06/2012   Hypothyroidism     Social Hx   Social History   Socioeconomic History   Marital status: Married    Spouse name: Not on file   Number of children: Not on file   Years of education: Not on file   Highest education level: Not on file  Occupational History   Not on file  Tobacco Use   Smoking status: Never   Smokeless tobacco: Never  Vaping Use   Vaping Use: Never used  Substance and Sexual Activity   Alcohol use: Yes    Alcohol/week: 2.0 standard drinks of alcohol    Types: 2 Standard drinks or equivalent per week   Drug use: No   Sexual activity: Yes    Birth control/protection: Pill    Comment: 1st intercourse 56 yo-Fewer than 5 partners  Other Topics Concern   Not on file  Social History Narrative   Not on file   Social Determinants of Health   Financial Resource Strain: Not on file  Food Insecurity: Not on file  Transportation Needs: Not on file  Physical Activity: Not on file  Stress: Not on file  Social Connections: Not on file    Review of Systems Per HPI  Objective:  BP 126/70   Pulse 86   Temp 97.7 F (36.5 C)   Ht '5\' 3"'$  (1.6 m)   Wt 167 lb (75.8 kg)   SpO2 99%   BMI 29.58 kg/m      09/26/2021    1:56 PM 09/19/2021    8:34  AM 03/06/2021    9:16 AM  BP/Weight  Systolic BP 850 277 412  Diastolic BP 70 83 84  Wt. (Lbs) 167 167.8   BMI 29.58 kg/m2 29.72 kg/m2     Physical Exam Constitutional:      General: She is not in acute distress.    Appearance: Normal appearance.  HENT:     Head: Normocephalic and atraumatic.  Cardiovascular:     Rate and Rhythm: Normal rate.  Pulmonary:     Effort: Pulmonary effort is normal.     Breath sounds: Normal breath sounds. No wheezing, rhonchi or rales.  Neurological:     Mental Status: She is alert.  Psychiatric:        Mood and Affect: Mood normal.        Behavior: Behavior normal.     Lab Results  Component Value Date   WBC 7.4 09/01/2021   HGB 15.9 09/01/2021   HCT 46.8 (H) 09/01/2021   PLT 271 09/01/2021   GLUCOSE 80 09/01/2021   CHOL 191 09/01/2021   TRIG 154 (H) 09/01/2021   HDL 51 09/01/2021   LDLCALC 113 (H) 09/01/2021   ALT 18  09/01/2021   AST 18 09/01/2021   NA 136 09/01/2021   K 4.0 09/01/2021   CL 97 09/01/2021   CREATININE 0.79 09/01/2021   BUN 9 09/01/2021   CO2 19 (L) 09/01/2021   TSH 0.562 09/01/2021     Assessment & Plan:   Problem List Items Addressed This Visit       Nervous and Auditory   BPPV (benign paroxysmal positional vertigo)    Continue Meclizine as needed. If persists, will send to vestibular rehab.        Follow-up:  If symptoms worsen  [place]

## 2021-09-27 NOTE — Assessment & Plan Note (Signed)
Continue Meclizine as needed. If persists, will send to vestibular rehab.

## 2021-11-28 ENCOUNTER — Ambulatory Visit: Payer: BC Managed Care – PPO | Admitting: Nurse Practitioner

## 2021-11-29 NOTE — Progress Notes (Unsigned)
Carol Estrada [birth year] [id number]   History:  56 y.o. G1P1001 presents for annual exam. Was prescribed vaginal estrogen for symptoms of urinary frequency with good improvement. Has not used for months. Monthly cycles. Normal pap history. History of Lichen sclerosus, HSV, hypothyroidism.   Gynecologic History No LMP recorded.   Contraception/Family planning: OCP (estrogen/progesterone) Sexually active: Yes  Health Maintenance Last Pap: 11/18/2019. Results were: Normal, 3-year repeat Last mammogram: 04/04/2021. Results were: Normal Last colonoscopy: Never Last Dexa: Not indicated  Past medical history, past surgical history, family history and social history were all reviewed and documented in the EPIC chart. Married. Works at [employer] 70 yo son, works with husband's business. Mother diagnosed with breast cancer at age 20.   ROS:  A ROS was performed and pertinent positives and negatives are included.  Exam:  There were no vitals filed for this visit.  There is no height or weight on file to calculate BMI.  General appearance:  Normal Thyroid:  Symmetrical, normal in size, without palpable masses or nodularity. Respiratory  Auscultation:  Clear without wheezing or rhonchi Cardiovascular  Auscultation:  Regular rate, without rubs, murmurs or gallops  Edema/varicosities:  Not grossly evident Abdominal  Soft,nontender, without masses, guarding or rebound.  Liver/spleen:  No organomegaly noted  Hernia:  None appreciated  Skin  Inspection:  Grossly normal Breasts: Examined lying and sitting.   Right: Without masses, retractions, nipple discharge or axillary adenopathy.   Left: Without masses, retractions, nipple discharge or axillary adenopathy. Genitourinary   Inguinal/mons:  Normal without inguinal adenopathy  External genitalia:  Normal appearing vulva with no masses, tenderness, or lesions  BUS/Urethra/Skene's glands:  Normal  Vagina:  Normal appearing  with normal color and discharge, no lesions  Cervix:  Normal appearing without discharge or lesions  Uterus:  Normal in size, shape and contour.  Midline and mobile, nontender  Adnexa/parametria:     Rt: Normal in size, without masses or tenderness.   Lt: Normal in size, without masses or tenderness.  Anus and perineum: Pearly appearance consistent with LS  Digital rectal exam: Normal sphincter tone without palpated masses or tenderness  Patient informed chaperone available to be present for breast and pelvic exam. Patient has requested no chaperone to be present. Patient has been advised what will be completed during breast and pelvic exam.   Assessment/Plan:  56 y.o. G1P1001 for annual exam.   Well female exam with routine gynecological exam - Education provided on SBEs, importance of preventative screenings, current guidelines, high calcium diet, regular exercise, and multivitamin daily.  Labs with PCP.   Lichen sclerosus - Recommend applying daily x 1-2 weeks then use twice weekly. She does not use Clobetasol often and is asymptomatic. Does not need refill at this time.   Encounter for surveillance of contraceptive pills - Plan: norethindrone-ethinyl estradiol (LOESTRIN) 1-20 MG-MCG tablet daily. Discussion regarding risk of continued use, especially with vaginal estrogen use. She has not been using the estrogen cream. She still has light monthly cycles. She would like to continue. Refill x 1 year provided.   Screening for cervical cancer - Normal Pap history.  Will repeat at 3-year interval per guidelines.  Screening for breast cancer - Normal mammogram history.  Overdue and is schedule in January. Normal breast exam today. Mother diagnosed with breast cancer at age 89.  Screening for colon cancer - Has not had screening colonoscopy. Discussed current guidelines and importance of preventative screenings. Is interested in Cologuard and will check with insurance.  Return in 1 year for  annual.   Tamela Gammon DNP, 2:53 PM 11/29/2021

## 2021-11-30 ENCOUNTER — Ambulatory Visit (INDEPENDENT_AMBULATORY_CARE_PROVIDER_SITE_OTHER): Payer: BC Managed Care – PPO | Admitting: Nurse Practitioner

## 2021-11-30 ENCOUNTER — Encounter: Payer: Self-pay | Admitting: Nurse Practitioner

## 2021-11-30 VITALS — BP 138/90 | HR 85 | Ht 63.5 in | Wt 168.0 lb

## 2021-11-30 DIAGNOSIS — L900 Lichen sclerosus et atrophicus: Secondary | ICD-10-CM | POA: Diagnosis not present

## 2021-11-30 DIAGNOSIS — B009 Herpesviral infection, unspecified: Secondary | ICD-10-CM | POA: Diagnosis not present

## 2021-11-30 DIAGNOSIS — Z30011 Encounter for initial prescription of contraceptive pills: Secondary | ICD-10-CM

## 2021-11-30 DIAGNOSIS — Z01419 Encounter for gynecological examination (general) (routine) without abnormal findings: Secondary | ICD-10-CM

## 2021-11-30 DIAGNOSIS — Z1211 Encounter for screening for malignant neoplasm of colon: Secondary | ICD-10-CM

## 2021-11-30 DIAGNOSIS — N952 Postmenopausal atrophic vaginitis: Secondary | ICD-10-CM | POA: Diagnosis not present

## 2021-11-30 DIAGNOSIS — Z3041 Encounter for surveillance of contraceptive pills: Secondary | ICD-10-CM

## 2021-11-30 MED ORDER — CLOBETASOL PROPIONATE 0.05 % EX CREA: 1.0000 | TOPICAL_CREAM | Freq: Two times a day (BID) | CUTANEOUS | 2 refills | Status: DC

## 2021-11-30 MED ORDER — NORETHINDRONE ACET-ETHINYL EST 1-20 MG-MCG PO TABS: 1.0000 | ORAL_TABLET | Freq: Every day | ORAL | 4 refills | Status: DC

## 2021-11-30 MED ORDER — ACYCLOVIR 400 MG PO TABS: ORAL_TABLET | ORAL | 3 refills | Status: DC

## 2021-11-30 MED ORDER — ESTRADIOL 0.1 MG/GM VA CREA: 1.0000 g | TOPICAL_CREAM | VAGINAL | 2 refills | Status: DC

## 2022-01-05 ENCOUNTER — Telehealth: Payer: BC Managed Care – PPO | Admitting: Family Medicine

## 2022-01-05 DIAGNOSIS — J069 Acute upper respiratory infection, unspecified: Secondary | ICD-10-CM

## 2022-01-05 MED ORDER — BENZONATATE 100 MG PO CAPS: 100.0000 mg | ORAL_CAPSULE | Freq: Two times a day (BID) | ORAL | 0 refills | Status: DC | PRN

## 2022-01-05 MED ORDER — FLUTICASONE PROPIONATE 50 MCG/ACT NA SUSP: 2.0000 | Freq: Every day | NASAL | 0 refills | Status: DC

## 2022-01-05 NOTE — Progress Notes (Signed)

## 2022-01-16 ENCOUNTER — Other Ambulatory Visit: Payer: Self-pay | Admitting: Family Medicine

## 2022-02-26 DIAGNOSIS — Z1211 Encounter for screening for malignant neoplasm of colon: Secondary | ICD-10-CM | POA: Diagnosis not present

## 2022-03-06 LAB — COLOGUARD: COLOGUARD: NEGATIVE

## 2022-03-22 ENCOUNTER — Encounter: Payer: Self-pay | Admitting: Family Medicine

## 2022-03-22 ENCOUNTER — Other Ambulatory Visit: Payer: Self-pay | Admitting: Family Medicine

## 2022-03-22 DIAGNOSIS — I10 Essential (primary) hypertension: Secondary | ICD-10-CM | POA: Insufficient documentation

## 2022-03-26 ENCOUNTER — Other Ambulatory Visit: Payer: Self-pay

## 2022-03-26 DIAGNOSIS — E785 Hyperlipidemia, unspecified: Secondary | ICD-10-CM

## 2022-03-26 DIAGNOSIS — E039 Hypothyroidism, unspecified: Secondary | ICD-10-CM

## 2022-03-26 DIAGNOSIS — I10 Essential (primary) hypertension: Secondary | ICD-10-CM

## 2022-03-27 ENCOUNTER — Ambulatory Visit: Payer: BC Managed Care – PPO | Admitting: Family Medicine

## 2022-03-27 DIAGNOSIS — E039 Hypothyroidism, unspecified: Secondary | ICD-10-CM | POA: Diagnosis not present

## 2022-03-27 DIAGNOSIS — E785 Hyperlipidemia, unspecified: Secondary | ICD-10-CM | POA: Diagnosis not present

## 2022-03-27 DIAGNOSIS — I10 Essential (primary) hypertension: Secondary | ICD-10-CM

## 2022-03-27 DIAGNOSIS — Z0000 Encounter for general adult medical examination without abnormal findings: Secondary | ICD-10-CM | POA: Insufficient documentation

## 2022-03-27 NOTE — Assessment & Plan Note (Signed)
Has been stable.  TSH ordered.  Continue current dosing of Synthroid for now.  Will adjust based on TSH.

## 2022-03-27 NOTE — Assessment & Plan Note (Signed)
Not at goal.  Awaiting lipid panel.  Continue pravastatin.

## 2022-03-27 NOTE — Assessment & Plan Note (Signed)
Healthcare maintenance updated today.  Cologuard is up-to-date.  Declines hepatitis C screening.  Declines shingles vaccine.

## 2022-03-27 NOTE — Patient Instructions (Signed)
Be sure to get your labs drawn.  Continue your medications.  Follow-up in 6 months.

## 2022-03-27 NOTE — Progress Notes (Signed)
Subjective:  Patient ID: Carol Estrada, female    DOB: [birth year]  Age: 57 y.o. MRN: [id number]  CC: Chief Complaint  Patient presents with   Hyperlipidemia    Follow up on cholesterol and thyroid meds No problems or concerns    HPI:  57 year old female presents for follow-up.  Patient recently had Cologuard done.  Negative.  Patient declines shingles vaccine.  Declines hepatitis C screening.  Unsure of last tetanus.  Blood pressure well-controlled.  Patient currently on triamterene/HCTZ.  Hypothyroidism has been stable on levothyroxine 100 mcg daily.  Needs labs.  Hyperlipidemia has been not at goal.  Last LDL was 113.  Needs labs.  Currently on pravastatin 80 mg daily.  Patient Active Problem List   Diagnosis Date Noted   Healthcare maintenance 03/27/2022   Essential hypertension 03/22/2022   BPPV (benign paroxysmal positional vertigo) [id number]   Lichen sclerosus 0000000   Hyperlipidemia 07/06/2012   Hypothyroidism     Social Hx   Social History   Socioeconomic History   Marital status: Married    Spouse name: Not on file   Number of children: Not on file   Years of education: Not on file   Highest education level: Not on file  Occupational History   Not on file  Tobacco Use   Smoking status: Never   Smokeless tobacco: Never  Vaping Use   Vaping Use: Never used  Substance and Sexual Activity   Alcohol use: Yes    Alcohol/week: 2.0 standard drinks of alcohol    Types: 2 Standard drinks or equivalent per week    Comment: occassionally/socially   Drug use: No   Sexual activity: Yes    Birth control/protection: Pill    Comment: 1st intercourse 57 yo-Fewer than 5 partners  Other Topics Concern   Not on file  Social History Narrative   Not on file   Social Determinants of Health   Financial Resource Strain: Not on file  Food Insecurity: Not on file  Transportation Needs: Not on file  Physical Activity: Not on file  Stress: Not on file  Social  Connections: Not on file    Review of Systems  Constitutional: Negative.   Respiratory: Negative.    Cardiovascular: Negative.    Objective:  BP 126/82   Ht 5' 3.5" (1.613 m)   Wt 168 lb 3.2 oz (76.3 kg)   BMI 29.33 kg/m      03/27/2022    8:31 AM 11/30/2021    8:54 AM 09/26/2021    1:56 PM  BP/Weight  Systolic BP 123XX123 0000000 123XX123  Diastolic BP 82 90 70  Wt. (Lbs) 168.2 168 167  BMI 29.33 kg/m2 29.29 kg/m2 29.58 kg/m2    Physical Exam Vitals and nursing note reviewed.  Constitutional:      General: She is not in acute distress.    Appearance: Normal appearance.  HENT:     Head: Normocephalic and atraumatic.  Eyes:     General:        Right eye: No discharge.        Left eye: No discharge.     Conjunctiva/sclera: Conjunctivae normal.  Cardiovascular:     Rate and Rhythm: Normal rate and regular rhythm.  Pulmonary:     Effort: Pulmonary effort is normal.     Breath sounds: Normal breath sounds. No wheezing, rhonchi or rales.  Abdominal:     General: There is no distension.     Palpations: Abdomen is soft.  Tenderness: There is no abdominal tenderness.  Neurological:     Mental Status: She is alert.  Psychiatric:        Mood and Affect: Mood normal.        Behavior: Behavior normal.     Lab Results  Component Value Date   WBC 7.4 09/01/2021   HGB 15.9 09/01/2021   HCT 46.8 (H) 09/01/2021   PLT 271 09/01/2021   GLUCOSE 80 09/01/2021   CHOL 191 09/01/2021   TRIG 154 (H) 09/01/2021   HDL 51 09/01/2021   LDLCALC 113 (H) 09/01/2021   ALT 18 09/01/2021   AST 18 09/01/2021   NA 136 09/01/2021   K 4.0 09/01/2021   CL 97 09/01/2021   CREATININE 0.79 09/01/2021   BUN 9 09/01/2021   CO2 19 (L) 09/01/2021   TSH 0.562 09/01/2021     Assessment & Plan:   Problem List Items Addressed This Visit       Cardiovascular and Mediastinum   Essential hypertension    Patient's blood pressure is well-controlled.        Endocrine   Hypothyroidism    Has been  stable.  TSH ordered.  Continue current dosing of Synthroid for now.  Will adjust based on TSH.        Other   Hyperlipidemia    Not at goal.  Awaiting lipid panel.  Continue pravastatin.      Healthcare maintenance    Healthcare maintenance updated today.  Cologuard is up-to-date.  Declines hepatitis C screening.  Declines shingles vaccine.      Follow-up:  Return in about 6 months (around 09/25/2022).  [facility]

## 2022-03-27 NOTE — Assessment & Plan Note (Signed)
Patient's blood pressure is well-controlled.

## 2022-03-28 DIAGNOSIS — E039 Hypothyroidism, unspecified: Secondary | ICD-10-CM | POA: Diagnosis not present

## 2022-03-28 DIAGNOSIS — E785 Hyperlipidemia, unspecified: Secondary | ICD-10-CM | POA: Diagnosis not present

## 2022-03-28 DIAGNOSIS — I10 Essential (primary) hypertension: Secondary | ICD-10-CM | POA: Diagnosis not present

## 2022-03-29 LAB — CBC WITH DIFFERENTIAL/PLATELET
Basophils Absolute: 0.1 10*3/uL (ref 0.0–0.2)
Basos: 1 %
EOS (ABSOLUTE): 0.1 10*3/uL (ref 0.0–0.4)
Eos: 1 %
Hematocrit: 45.8 % (ref 34.0–46.6)
Hemoglobin: 15.8 g/dL (ref 11.1–15.9)
Immature Grans (Abs): 0 10*3/uL (ref 0.0–0.1)
Immature Granulocytes: 0 %
Lymphocytes Absolute: 2.7 10*3/uL (ref 0.7–3.1)
Lymphs: 40 %
MCH: 33.6 pg — ABNORMAL HIGH (ref 26.6–33.0)
MCHC: 34.5 g/dL (ref 31.5–35.7)
MCV: 97 fL (ref 79–97)
Monocytes Absolute: 0.6 10*3/uL (ref 0.1–0.9)
Monocytes: 9 %
Neutrophils Absolute: 3.3 10*3/uL (ref 1.4–7.0)
Neutrophils: 49 %
Platelets: 320 10*3/uL (ref 150–450)
RBC: 4.7 x10E6/uL (ref 3.77–5.28)
RDW: 12 % (ref 11.7–15.4)
WBC: 6.8 10*3/uL (ref 3.4–10.8)

## 2022-03-29 LAB — LIPID PANEL
Chol/HDL Ratio: 4 ratio (ref 0.0–4.4)
Cholesterol, Total: 200 mg/dL — ABNORMAL HIGH (ref 100–199)
HDL: 50 mg/dL (ref >39)
LDL Chol Calc (NIH): 120 mg/dL — ABNORMAL HIGH (ref 0–99)
Triglycerides: 172 mg/dL — ABNORMAL HIGH (ref 0–149)
VLDL Cholesterol Cal: 30 mg/dL (ref 5–40)

## 2022-03-29 LAB — CMP14+EGFR
ALT: 12 IU/L (ref 0–32)
AST: 14 IU/L (ref 0–40)
Albumin/Globulin Ratio: 1.8 (ref 1.2–2.2)
Albumin: 4.2 g/dL (ref 3.8–4.9)
Alkaline Phosphatase: 64 IU/L (ref 44–121)
BUN/Creatinine Ratio: 10 (ref 9–23)
BUN: 8 mg/dL (ref 6–24)
Bilirubin Total: 0.3 mg/dL (ref 0.0–1.2)
CO2: 22 mmol/L (ref 20–29)
Calcium: 9.3 mg/dL (ref 8.7–10.2)
Chloride: 100 mmol/L (ref 96–106)
Creatinine, Ser: 0.79 mg/dL (ref 0.57–1.00)
Globulin, Total: 2.4 g/dL (ref 1.5–4.5)
Glucose: 83 mg/dL (ref 70–99)
Potassium: 4 mmol/L (ref 3.5–5.2)
Sodium: 136 mmol/L (ref 134–144)
Total Protein: 6.6 g/dL (ref 6.0–8.5)
eGFR: 89 mL/min/{1.73_m2} (ref >59)

## 2022-03-29 LAB — TSH: TSH: 2.01 u[IU]/mL (ref 0.450–4.500)

## 2022-04-19 ENCOUNTER — Other Ambulatory Visit: Payer: Self-pay | Admitting: Family Medicine

## 2022-04-30 ENCOUNTER — Other Ambulatory Visit: Payer: Self-pay | Admitting: Family Medicine

## 2022-05-03 ENCOUNTER — Other Ambulatory Visit: Payer: Self-pay | Admitting: Family Medicine

## 2022-06-18 ENCOUNTER — Other Ambulatory Visit: Payer: Self-pay | Admitting: Family Medicine

## 2022-06-20 ENCOUNTER — Other Ambulatory Visit: Payer: Self-pay | Admitting: Family Medicine

## 2022-06-20 DIAGNOSIS — J069 Acute upper respiratory infection, unspecified: Secondary | ICD-10-CM

## 2022-08-28 ENCOUNTER — Encounter: Payer: Self-pay | Admitting: Family Medicine

## 2022-08-29 ENCOUNTER — Other Ambulatory Visit: Payer: Self-pay

## 2022-08-29 DIAGNOSIS — Z0000 Encounter for general adult medical examination without abnormal findings: Secondary | ICD-10-CM

## 2022-08-29 DIAGNOSIS — Z79899 Other long term (current) drug therapy: Secondary | ICD-10-CM

## 2022-08-29 DIAGNOSIS — E785 Hyperlipidemia, unspecified: Secondary | ICD-10-CM

## 2022-08-29 DIAGNOSIS — E039 Hypothyroidism, unspecified: Secondary | ICD-10-CM

## 2022-08-29 DIAGNOSIS — I10 Essential (primary) hypertension: Secondary | ICD-10-CM

## 2022-09-12 DIAGNOSIS — E039 Hypothyroidism, unspecified: Secondary | ICD-10-CM | POA: Diagnosis not present

## 2022-09-12 DIAGNOSIS — E785 Hyperlipidemia, unspecified: Secondary | ICD-10-CM | POA: Diagnosis not present

## 2022-09-12 DIAGNOSIS — Z79899 Other long term (current) drug therapy: Secondary | ICD-10-CM | POA: Diagnosis not present

## 2022-09-12 DIAGNOSIS — I10 Essential (primary) hypertension: Secondary | ICD-10-CM | POA: Diagnosis not present

## 2022-09-12 DIAGNOSIS — Z0000 Encounter for general adult medical examination without abnormal findings: Secondary | ICD-10-CM | POA: Diagnosis not present

## 2022-09-13 LAB — LIPID PANEL: Chol/HDL Ratio: 3.6 ratio (ref 0.0–4.4)

## 2022-09-13 LAB — CBC WITH DIFFERENTIAL/PLATELET: Neutrophils: 51 %

## 2022-09-20 ENCOUNTER — Encounter: Payer: Self-pay | Admitting: *Deleted

## 2022-09-25 ENCOUNTER — Ambulatory Visit: Payer: BC Managed Care – PPO | Admitting: Family Medicine

## 2022-09-25 VITALS — BP 128/86 | HR 83 | Temp 97.9°F | Ht 63.5 in | Wt 165.8 lb

## 2022-09-25 DIAGNOSIS — E785 Hyperlipidemia, unspecified: Secondary | ICD-10-CM

## 2022-09-25 DIAGNOSIS — I10 Essential (primary) hypertension: Secondary | ICD-10-CM

## 2022-09-25 DIAGNOSIS — E039 Hypothyroidism, unspecified: Secondary | ICD-10-CM | POA: Diagnosis not present

## 2022-09-25 MED ORDER — TRIAMTERENE-HCTZ 37.5-25 MG PO TABS: ORAL_TABLET | ORAL | 3 refills | Status: AC

## 2022-09-25 NOTE — Assessment & Plan Note (Signed)
Stable on current dosing of levothyroxine.  Continue.

## 2022-09-25 NOTE — Progress Notes (Signed)
Subjective:  Patient ID: Carol Estrada, female    DOB: 1966-01-07  Age: 57 y.o. MRN: [id number]  CC: Chief Complaint  Patient presents with   Hypothyroidism   Follow-up    HPI:  57 year old female with the below mentioned medical problems presents for follow-up.  Patient states that she is doing well.  Hypertension is well-controlled.  Labs recently obtained.  Reviewed today.  Cholesterol and LDL remain mildly elevated.  She is on pravastatin.  Will discuss this today.  Thyroid testing normal.  Hypothyroidism stable on 100 mcg of levothyroxine.  Patient Active Problem List   Diagnosis Date Noted   Healthcare maintenance 03/27/2022   Essential hypertension 03/22/2022   Lichen sclerosus 03/06/2021   Hyperlipidemia 07/06/2012   Hypothyroidism     Social Hx   Social History   Socioeconomic History   Marital status: Married    Spouse name: Not on file   Number of children: Not on file   Years of education: Not on file   Highest education level: Not on file  Occupational History   Not on file  Tobacco Use   Smoking status: Never   Smokeless tobacco: Never  Vaping Use   Vaping status: Never Used  Substance and Sexual Activity   Alcohol use: Yes    Alcohol/week: 2.0 standard drinks of alcohol    Types: 2 Standard drinks or equivalent per week    Comment: occassionally/socially   Drug use: No   Sexual activity: Yes    Birth control/protection: Pill    Comment: 1st intercourse 57 yo-Fewer than 5 partners  Other Topics Concern   Not on file  Social History Narrative   Not on file   Social Determinants of Health   Financial Resource Strain: Not on file  Food Insecurity: Not on file  Transportation Needs: Not on file  Physical Activity: Not on file  Stress: Not on file  Social Connections: Not on file    Review of Systems  Respiratory: Negative.    Cardiovascular: Negative.     Objective:  BP 128/86   Pulse 83   Temp 97.9 F (36.6 C)   Ht 5'  3.5" (1.613 m)   Wt 165 lb 12.8 oz (75.2 kg)   SpO2 98%   BMI 28.91 kg/m      09/25/2022    8:36 AM 03/27/2022    8:31 AM 11/30/2021    8:54 AM  BP/Weight  Systolic BP 128 126 138  Diastolic BP 86 82 90  Wt. (Lbs) 165.8 168.2 168  BMI 28.91 kg/m2 29.33 kg/m2 29.29 kg/m2    Physical Exam Vitals and nursing note reviewed.  Constitutional:      General: She is not in acute distress.    Appearance: Normal appearance.  HENT:     Head: Normocephalic and atraumatic.  Eyes:     General:        Right eye: No discharge.        Left eye: No discharge.     Conjunctiva/sclera: Conjunctivae normal.  Cardiovascular:     Rate and Rhythm: Normal rate and regular rhythm.  Pulmonary:     Effort: Pulmonary effort is normal.     Breath sounds: Normal breath sounds. No wheezing, rhonchi or rales.  Neurological:     Mental Status: She is alert.  Psychiatric:        Mood and Affect: Mood normal.        Behavior: Behavior normal.     Lab Results  Component Value Date   WBC 7.1 09/12/2022   HGB 15.9 09/12/2022   HCT 46.8 (H) 09/12/2022   PLT 330 09/12/2022   GLUCOSE 86 09/12/2022   CHOL 204 (H) 09/12/2022   TRIG 137 09/12/2022   HDL 57 09/12/2022   LDLCALC 123 (H) 09/12/2022   ALT 15 09/12/2022   AST 19 09/12/2022   NA 135 09/12/2022   K 4.0 09/12/2022   CL 100 09/12/2022   CREATININE 0.80 09/12/2022   BUN 9 09/12/2022   CO2 19 (L) 09/12/2022   TSH 2.890 09/12/2022     Assessment & Plan:   Problem List Items Addressed This Visit       Cardiovascular and Mediastinum   Essential hypertension - Primary    Stable.  Continue triamterene/HCTZ.      Relevant Medications   triamterene-hydrochlorothiazide (MAXZIDE-25) 37.5-25 MG tablet     Endocrine   Hypothyroidism    Stable on current dosing of levothyroxine.  Continue.        Other   Hyperlipidemia    Discussed switching to high intensity statin.  Also discussed adding therapy to the pravastatin.  Patient elects to  continue with her current dosing of pravastatin and continue monitoring.      Relevant Medications   triamterene-hydrochlorothiazide (MAXZIDE-25) 37.5-25 MG tablet    Meds ordered this encounter  Medications   triamterene-hydrochlorothiazide (MAXZIDE-25) 37.5-25 MG tablet    Sig: TAKE 1 TABLET BY MOUTH EVERY MORNING AS NEEDED FOR SWELLING.    Dispense:  90 tablet    Refill:  3    Follow-up: Annually  Everlene Other DO [facility]

## 2022-09-25 NOTE — Assessment & Plan Note (Signed)
Discussed switching to high intensity statin.  Also discussed adding therapy to the pravastatin.  Patient elects to continue with her current dosing of pravastatin and continue monitoring.

## 2022-09-25 NOTE — Patient Instructions (Signed)
Continue your medications.  Follow up in 6 months to 1 year.  Take care  Dr. Lacinda Axon

## 2022-09-25 NOTE — Assessment & Plan Note (Signed)
Stable.  Continue triamterene/HCTZ. 

## 2022-10-15 ENCOUNTER — Telehealth: Payer: Self-pay

## 2022-10-15 MED ORDER — LEVOTHYROXINE SODIUM 100 MCG PO TABS: 100.0000 ug | ORAL_TABLET | Freq: Every day | ORAL | 0 refills | Status: DC

## 2022-10-15 NOTE — Telephone Encounter (Signed)
Received via fax Rx request: Prescription sent electronically to pharmacy  

## 2022-10-15 NOTE — Telephone Encounter (Signed)
Prescription Request  10/15/2022  LOV: Visit date not found  What is the name of the medication or equipment? levothyroxine (SYNTHROID) 100 MCG tablet   Have you contacted your pharmacy to request a refill? Yes   Which pharmacy would you like this sent to?  [facility] - [place], [place] - [address] [place] [address] Phone: [phone] Fax: [phone]    Patient notified that their request is being sent to the clinical staff for review and that they should receive a response within 2 business days.   Please advise at Mobile [phone] (mobile)

## 2022-12-04 ENCOUNTER — Ambulatory Visit (INDEPENDENT_AMBULATORY_CARE_PROVIDER_SITE_OTHER): Payer: BC Managed Care – PPO | Admitting: Nurse Practitioner

## 2022-12-04 ENCOUNTER — Other Ambulatory Visit (HOSPITAL_COMMUNITY)
Admission: RE | Admit: 2022-12-04 | Discharge: 2022-12-04 | Disposition: A | Discharge status: Home / Self Care | Payer: BC Managed Care – PPO | Source: Ambulatory Visit | Attending: Nurse Practitioner | Admitting: Nurse Practitioner

## 2022-12-04 ENCOUNTER — Encounter: Payer: Self-pay | Admitting: Nurse Practitioner

## 2022-12-04 VITALS — BP 124/76 | HR 79 | Ht 64.0 in | Wt 168.0 lb

## 2022-12-04 DIAGNOSIS — B009 Herpesviral infection, unspecified: Secondary | ICD-10-CM

## 2022-12-04 DIAGNOSIS — L900 Lichen sclerosus et atrophicus: Secondary | ICD-10-CM | POA: Diagnosis not present

## 2022-12-04 DIAGNOSIS — Z3041 Encounter for surveillance of contraceptive pills: Secondary | ICD-10-CM

## 2022-12-04 DIAGNOSIS — Z124 Encounter for screening for malignant neoplasm of cervix: Secondary | ICD-10-CM

## 2022-12-04 DIAGNOSIS — N952 Postmenopausal atrophic vaginitis: Secondary | ICD-10-CM

## 2022-12-04 DIAGNOSIS — Z01419 Encounter for gynecological examination (general) (routine) without abnormal findings: Secondary | ICD-10-CM | POA: Diagnosis not present

## 2022-12-04 MED ORDER — ESTRADIOL 0.1 MG/GM VA CREA
1.0000 g | TOPICAL_CREAM | VAGINAL | 2 refills | Status: DC
Start: 2022-12-06 — End: 2023-12-05

## 2022-12-04 MED ORDER — ACYCLOVIR 400 MG PO TABS
400.0000 mg | ORAL_TABLET | Freq: Two times a day (BID) | ORAL | 2 refills | Status: DC
Start: 2022-12-04 — End: 2023-07-23

## 2022-12-04 MED ORDER — CLOBETASOL PROPIONATE 0.05 % EX OINT
1.0000 | TOPICAL_OINTMENT | CUTANEOUS | 2 refills | Status: DC
Start: 2022-12-06 — End: 2023-09-26

## 2022-12-04 MED ORDER — NORETHINDRONE ACET-ETHINYL EST 1-20 MG-MCG PO TABS
1.0000 | ORAL_TABLET | Freq: Every day | ORAL | 4 refills | Status: DC
Start: 2022-12-04 — End: 2023-12-05

## 2022-12-04 NOTE — Progress Notes (Signed)
Carol Estrada Aug 07, 1965 [id number]   History:  57 y.o. G1P1001 presents for annual exam. Monthly cycles. Menses have lightened up, denies menopausal symptoms. Uses Clobetasol as needed for lichen sclerosus. Using vaginal estrogen for urinary symptoms. Only using a few times per month but does notice a difference with use. Normal pap history. H/O HSV, HLD, hypothyroidism.   Gynecologic History Patient's last menstrual period was 11/21/2022. Period Cycle (Days): 28 Period Duration (Days): 3-4 Period Pattern: Regular Menstrual Flow: Light Menstrual Control: Panty liner Dysmenorrhea: None Contraception/Family planning: OCP (estrogen/progesterone) Sexually active: Yes  Health Maintenance Last Pap: 11/18/2019. Results were: Normal, 3-year repeat Last mammogram: 04/04/2021. Results were: Normal Last colonoscopy: Never. Negative Cologuard 02/26/2022 Last Dexa: Not indicated  Past medical history, past surgical history, family history and social history were all reviewed and documented in the EPIC chart. Married. Works at [employer]. 63 yo son, works with [employer]. Mother diagnosed with breast cancer at age 54.   ROS:  A ROS was performed and pertinent positives and negatives are included.  Exam:  Vitals:   12/04/22 0906  BP: 124/76  Pulse: 79  SpO2: 99%  Weight: 168 lb (76.2 kg)  Height: 5\' 4"  (1.626 m)     Body mass index is 28.84 kg/m.  General appearance:  Normal Thyroid:  Symmetrical, normal in size, without palpable masses or nodularity. Respiratory  Auscultation:  Clear without wheezing or rhonchi Cardiovascular  Auscultation:  Regular rate, without rubs, murmurs or gallops  Edema/varicosities:  Not grossly evident Abdominal  Soft,nontender, without masses, guarding or rebound.  Liver/spleen:  No organomegaly noted  Hernia:  None appreciated  Skin  Inspection:  Grossly normal Breasts: Examined lying and sitting.   Right: Without masses,  retractions, nipple discharge or axillary adenopathy.   Left: Without masses, retractions, nipple discharge or axillary adenopathy. Pelvic: External genitalia:  no lesions              Urethra:  normal appearing urethra with no masses, tenderness or lesions              Bartholins and Skenes: normal                 Vagina: normal appearing vagina with normal color and discharge, no lesions              Cervix: no lesions Bimanual Exam:  Uterus:  no masses or tenderness              Adnexa: no mass, fullness, tenderness              Rectovaginal: Deferred              Anus:  normal, no lesions  Patient informed chaperone available to be present for breast and pelvic exam. Patient has requested no chaperone to be present. Patient has been advised what will be completed during breast and pelvic exam.   Assessment/Plan:  57 y.o. G1P1001 for annual exam.   Well female exam with routine gynecological exam - Education provided on SBEs, importance of preventative screenings, current guidelines, high calcium diet, regular exercise, and multivitamin daily.  Labs with PCP.   Lichen sclerosus - Plan: clobetasol cream (TEMOVATE) 0.05 % twice weekly. Has had less flares this year, so has been using as needed.    Encounter for surveillance of contraceptive pills - Plan: norethindrone-ethinyl estradiol (LOESTRIN) 1-20 MG-MCG tablet daily. Having light monthly cycles. Refill x 1 year provided.   Atrophic vaginitis - Plan: estradiol (  ESTRACE VAGINAL) 0.1 MG/GM vaginal cream twice weekly for urinary symptoms. Good management.  Cervical cancer screening - Plan: Cytology - PAP( [facility]). Normal pap history.   HSV-1 infection - Plan: acyclovir (ZOVIRAX) 400 MG tablet BID x 3-5 days at first sign of outbreak. Outbreaks occur on external nose.   Screening for breast cancer - Normal mammogram history. Continue annual screenings. Normal breast exam today.   Screening for colon cancer - Negative Cologuard  01/2022. Will repeat at 3-year interval per recommendation.   Return in about 1 year (around 12/04/2023) for Annual.     Olivia Mackie DNP, 9:31 AM 12/04/2022

## 2022-12-07 LAB — CYTOLOGY - PAP
Adequacy: ABSENT
Comment: NEGATIVE
Diagnosis: NEGATIVE
High risk HPV: NEGATIVE

## 2022-12-14 ENCOUNTER — Other Ambulatory Visit: Payer: Self-pay | Admitting: Family Medicine

## 2023-01-07 ENCOUNTER — Ambulatory Visit: Payer: BC Managed Care – PPO | Admitting: Family Medicine

## 2023-01-08 ENCOUNTER — Other Ambulatory Visit: Payer: Self-pay | Admitting: Family Medicine

## 2023-01-08 DIAGNOSIS — Z1231 Encounter for screening mammogram for malignant neoplasm of breast: Secondary | ICD-10-CM

## 2023-01-15 ENCOUNTER — Other Ambulatory Visit: Payer: Self-pay | Admitting: Family Medicine

## 2023-02-07 ENCOUNTER — Ambulatory Visit
Admission: RE | Admit: 2023-02-07 | Discharge: 2023-02-07 | Disposition: A | Discharge status: Home / Self Care | Payer: BC Managed Care – PPO | Source: Ambulatory Visit

## 2023-02-07 DIAGNOSIS — Z1231 Encounter for screening mammogram for malignant neoplasm of breast: Secondary | ICD-10-CM | POA: Diagnosis not present

## 2023-04-15 ENCOUNTER — Other Ambulatory Visit: Payer: Self-pay | Admitting: Family Medicine

## 2023-05-03 ENCOUNTER — Telehealth: Admitting: Family Medicine

## 2023-05-03 DIAGNOSIS — J988 Other specified respiratory disorders: Secondary | ICD-10-CM | POA: Diagnosis not present

## 2023-05-03 MED ORDER — DOXYCYCLINE HYCLATE 100 MG PO TABS: 100.0000 mg | ORAL_TABLET | Freq: Two times a day (BID) | ORAL | 0 refills | Status: DC

## 2023-05-03 MED ORDER — PROMETHAZINE-DM 6.25-15 MG/5ML PO SYRP: 5.0000 mL | ORAL_SOLUTION | Freq: Four times a day (QID) | ORAL | 0 refills | Status: DC | PRN

## 2023-05-05 NOTE — Progress Notes (Signed)
 Virtual Visit via Video Note  I connected with Jerolyn Shin on 05/05/23 at 10:40 AM EDT by a video enabled telemedicine application and verified that I am speaking with the correct person using two identifiers.  Location: Patient: Home Provider: Office   I discussed the limitations of evaluation and management by telemedicine and the availability of in person appointments. The patient expressed understanding and agreed to proceed.  History of Present Illness: 58 year old female presents for evaluation of respiratory symptoms.  5-day history of congestion, cough, sneezing.  Patient reports that she is having ear fullness as well.  No fever.  She reports yellow mucus.  She has been using Tylenol, Zyrtec, Flonase, Mucinex, Delsym without relief.  No reported sick contacts.   Observations/Objective: General: Well-appearing female in no acute distress. Respiratory: Speaking in full sentences.  No apparent respiratory distress.  Assessment and Plan: Respiratory infection Treating with doxycycline and Promethazine DM.  Follow Up Instructions:    I discussed the assessment and treatment plan with the patient. The patient was provided an opportunity to ask questions and all were answered. The patient agreed with the plan and demonstrated an understanding of the instructions.   The patient was advised to call back or seek an in-person evaluation if the symptoms worsen or if the condition fails to improve as anticipated.  I provided 5 minutes of non-face-to-face time during this encounter.   Tommie Sams, DO

## 2023-06-06 ENCOUNTER — Other Ambulatory Visit: Payer: Self-pay | Admitting: Family Medicine

## 2023-07-20 ENCOUNTER — Other Ambulatory Visit: Payer: Self-pay | Admitting: Nurse Practitioner

## 2023-07-20 DIAGNOSIS — B009 Herpesviral infection, unspecified: Secondary | ICD-10-CM

## 2023-07-22 NOTE — Telephone Encounter (Signed)
 Med refill request: Zovirax  Last AEX:12/04/22 TW Next AEX: 12/05/23 TW Last MMG (if hormonal med) 02/07/23 Refill authorized: Last Rx sent #[id number] with 2 refills on 12/04/22 TW. Please approve or deny.

## 2023-07-24 ENCOUNTER — Other Ambulatory Visit: Payer: Self-pay | Admitting: Family Medicine

## 2023-09-19 ENCOUNTER — Encounter: Payer: Self-pay | Admitting: Family Medicine

## 2023-09-22 ENCOUNTER — Other Ambulatory Visit: Payer: Self-pay | Admitting: Family Medicine

## 2023-09-22 DIAGNOSIS — E039 Hypothyroidism, unspecified: Secondary | ICD-10-CM

## 2023-09-22 DIAGNOSIS — I10 Essential (primary) hypertension: Secondary | ICD-10-CM

## 2023-09-22 DIAGNOSIS — Z130 Encounter for screening for diseases of the blood and blood-forming organs and certain disorders involving the immune mechanism: Secondary | ICD-10-CM

## 2023-09-22 DIAGNOSIS — E785 Hyperlipidemia, unspecified: Secondary | ICD-10-CM

## 2023-09-25 DIAGNOSIS — Z130 Encounter for screening for diseases of the blood and blood-forming organs and certain disorders involving the immune mechanism: Secondary | ICD-10-CM | POA: Diagnosis not present

## 2023-09-25 DIAGNOSIS — E039 Hypothyroidism, unspecified: Secondary | ICD-10-CM | POA: Diagnosis not present

## 2023-09-25 DIAGNOSIS — I10 Essential (primary) hypertension: Secondary | ICD-10-CM | POA: Diagnosis not present

## 2023-09-25 DIAGNOSIS — E785 Hyperlipidemia, unspecified: Secondary | ICD-10-CM | POA: Diagnosis not present

## 2023-09-26 ENCOUNTER — Encounter: Payer: Self-pay | Admitting: Family Medicine

## 2023-09-26 ENCOUNTER — Ambulatory Visit (INDEPENDENT_AMBULATORY_CARE_PROVIDER_SITE_OTHER): Payer: BC Managed Care – PPO | Admitting: Family Medicine

## 2023-09-26 VITALS — BP 130/86 | HR 70 | Temp 97.3°F | Ht 64.0 in | Wt 165.0 lb

## 2023-09-26 DIAGNOSIS — Z0001 Encounter for general adult medical examination with abnormal findings: Secondary | ICD-10-CM

## 2023-09-26 DIAGNOSIS — Z0000 Encounter for general adult medical examination without abnormal findings: Secondary | ICD-10-CM | POA: Insufficient documentation

## 2023-09-26 DIAGNOSIS — B009 Herpesviral infection, unspecified: Secondary | ICD-10-CM | POA: Diagnosis not present

## 2023-09-26 LAB — LIPID PANEL
Chol/HDL Ratio: 3.7 ratio (ref 0.0–4.4)
Cholesterol, Total: 202 mg/dL — ABNORMAL HIGH (ref 100–199)
HDL: 55 mg/dL (ref >39)
LDL Chol Calc (NIH): 123 mg/dL — ABNORMAL HIGH (ref 0–99)
Triglycerides: 134 mg/dL (ref 0–149)
VLDL Cholesterol Cal: 24 mg/dL (ref 5–40)

## 2023-09-26 LAB — CBC
Hematocrit: 46.5 % (ref 34.0–46.6)
Hemoglobin: 15.7 g/dL (ref 11.1–15.9)
MCH: 33.2 pg — ABNORMAL HIGH (ref 26.6–33.0)
MCHC: 33.8 g/dL (ref 31.5–35.7)
MCV: 98 fL — ABNORMAL HIGH (ref 79–97)
Platelets: 282 x10E3/uL (ref 150–450)
RBC: 4.73 x10E6/uL (ref 3.77–5.28)
RDW: 12 % (ref 11.7–15.4)
WBC: 6.7 x10E3/uL (ref 3.4–10.8)

## 2023-09-26 LAB — CMP14+EGFR
ALT: 21 IU/L (ref 0–32)
AST: 20 IU/L (ref 0–40)
Albumin: 4 g/dL (ref 3.8–4.9)
Alkaline Phosphatase: 79 IU/L (ref 44–121)
BUN/Creatinine Ratio: 14 (ref 9–23)
BUN: 12 mg/dL (ref 6–24)
Bilirubin Total: 0.5 mg/dL (ref 0.0–1.2)
CO2: 18 mmol/L — ABNORMAL LOW (ref 20–29)
Calcium: 9.2 mg/dL (ref 8.7–10.2)
Chloride: 103 mmol/L (ref 96–106)
Creatinine, Ser: 0.83 mg/dL (ref 0.57–1.00)
Globulin, Total: 2.5 g/dL (ref 1.5–4.5)
Glucose: 87 mg/dL (ref 70–99)
Potassium: 4.1 mmol/L (ref 3.5–5.2)
Sodium: 138 mmol/L (ref 134–144)
Total Protein: 6.5 g/dL (ref 6.0–8.5)
eGFR: 83 mL/min/1.73 (ref >59)

## 2023-09-26 LAB — TSH: TSH: 2.09 u[IU]/mL (ref 0.450–4.500)

## 2023-09-26 MED ORDER — ACYCLOVIR 400 MG PO TABS: ORAL_TABLET | ORAL | Status: AC

## 2023-09-26 NOTE — Progress Notes (Signed)
 Subjective:  Patient ID: Carol Estrada, female    DOB: [birth year]  Age: 58 y.o. MRN: [id number]  CC:   Chief Complaint  Patient presents with   Annual Exam    HPI:  58 year old female presents for follow-up.  Labs reviewed.  Labs stable.  In regards to her preventative health care, colon cancer screening, cervical cancer screening, and breast cancer screening up-to-date.  Patient is due for flu, tetanus, shingles, and pneumococcal vaccines.  She declines these today.  Denies chest pain or shortness of breath.  Doing well at this time.  Patient Active Problem List   Diagnosis Date Noted   Annual physical exam 09/26/2023   Essential hypertension 03/22/2022   Lichen sclerosus 03/06/2021   Hyperlipidemia 07/06/2012   Hypothyroidism     Social Hx   Social History   Socioeconomic History   Marital status: Married    Spouse name: Not on file   Number of children: Not on file   Years of education: Not on file   Highest education level: Not on file  Occupational History   Not on file  Tobacco Use   Smoking status: Never   Smokeless tobacco: Never  Vaping Use   Vaping status: Never Used  Substance and Sexual Activity   Alcohol use: Yes    Alcohol/week: 2.0 standard drinks of alcohol    Types: 2 Standard drinks or equivalent per week    Comment: occassionally/socially   Drug use: No   Sexual activity: Yes    Birth control/protection: Pill    Comment: 1st intercourse 58 yo-Fewer than 5 partners  Other Topics Concern   Not on file  Social History Narrative   Not on file   Social Drivers of Health   Financial Resource Strain: Not on file  Food Insecurity: Not on file  Transportation Needs: Not on file  Physical Activity: Not on file  Stress: Not on file  Social Connections: Not on file    Review of Systems Per HPI  Objective:  BP 130/86   Pulse 70   Temp (!) 97.3 F (36.3 C)   Ht 5' 4 (1.626 m)   Wt 165 lb (74.8 kg)   SpO2 99%   BMI 28.32 kg/m       09/26/2023   11:06 AM 12/04/2022    9:06 AM 09/25/2022    8:36 AM  BP/Weight  Systolic BP 130 124 128  Diastolic BP 86 76 86  Wt. (Lbs) 165 168 165.8  BMI 28.32 kg/m2 28.84 kg/m2 28.91 kg/m2    Physical Exam Vitals and nursing note reviewed.  Constitutional:      General: She is not in acute distress.    Appearance: Normal appearance.  HENT:     Head: Normocephalic and atraumatic.  Eyes:     General:        Right eye: No discharge.        Left eye: No discharge.     Conjunctiva/sclera: Conjunctivae normal.  Cardiovascular:     Rate and Rhythm: Normal rate and regular rhythm.  Pulmonary:     Effort: Pulmonary effort is normal.     Breath sounds: Normal breath sounds. No wheezing, rhonchi or rales.  Neurological:     Mental Status: She is alert.  Psychiatric:        Mood and Affect: Mood normal.        Behavior: Behavior normal.     Lab Results  Component Value Date   WBC 6.7 09/25/2023  HGB 15.7 09/25/2023   HCT 46.5 09/25/2023   PLT 282 09/25/2023   GLUCOSE 87 09/25/2023   CHOL 202 (H) 09/25/2023   TRIG 134 09/25/2023   HDL 55 09/25/2023   LDLCALC 123 (H) 09/25/2023   ALT 21 09/25/2023   AST 20 09/25/2023   NA 138 09/25/2023   K 4.1 09/25/2023   CL 103 09/25/2023   CREATININE 0.83 09/25/2023   BUN 12 09/25/2023   CO2 18 (L) 09/25/2023   TSH 2.090 09/25/2023     Assessment & Plan:  Annual physical exam Assessment & Plan: Doing well.  Labs reviewed.  Preventative healthcare items discussed.  Healthcare maintenance section was updated.  Supportive care.  Follow-up annually.   HSV-1 infection -     Acyclovir ; TAKE ONE TABLET BY MOUTH TWICE DAILY FOR 3-5 DAYS AT FIRST SIGN OF OUTBREAK   Jacqulyn Ahle DO [facility]

## 2023-09-26 NOTE — Assessment & Plan Note (Signed)
 Doing well.  Labs reviewed.  Preventative healthcare items discussed.  Healthcare maintenance section was updated.  Supportive care.  Follow-up annually.

## 2023-09-26 NOTE — Patient Instructions (Signed)
Follow up annually. ? ?Take care ? ?Dr. Kivon Aprea  ?

## 2023-10-14 ENCOUNTER — Encounter: Payer: Self-pay | Admitting: Family Medicine

## 2023-10-14 ENCOUNTER — Other Ambulatory Visit: Payer: Self-pay | Admitting: Family Medicine

## 2023-10-14 DIAGNOSIS — J069 Acute upper respiratory infection, unspecified: Secondary | ICD-10-CM

## 2023-10-17 NOTE — Telephone Encounter (Signed)
 Cook, Jayce G, DO     10/16/23  9:52 PM She can schedule a nurse visit or get at her local pharmacy.

## 2023-11-28 ENCOUNTER — Other Ambulatory Visit: Payer: Self-pay | Admitting: Family Medicine

## 2023-12-05 ENCOUNTER — Ambulatory Visit (INDEPENDENT_AMBULATORY_CARE_PROVIDER_SITE_OTHER): Payer: BC Managed Care – PPO | Admitting: Nurse Practitioner

## 2023-12-05 ENCOUNTER — Encounter: Payer: Self-pay | Admitting: Nurse Practitioner

## 2023-12-05 VITALS — BP 112/70 | HR 75 | Ht 63.25 in | Wt 165.0 lb

## 2023-12-05 DIAGNOSIS — B009 Herpesviral infection, unspecified: Secondary | ICD-10-CM

## 2023-12-05 DIAGNOSIS — Z1331 Encounter for screening for depression: Secondary | ICD-10-CM

## 2023-12-05 DIAGNOSIS — N952 Postmenopausal atrophic vaginitis: Secondary | ICD-10-CM

## 2023-12-05 DIAGNOSIS — Z01419 Encounter for gynecological examination (general) (routine) without abnormal findings: Secondary | ICD-10-CM

## 2023-12-05 DIAGNOSIS — L900 Lichen sclerosus et atrophicus: Secondary | ICD-10-CM

## 2023-12-05 DIAGNOSIS — Z3041 Encounter for surveillance of contraceptive pills: Secondary | ICD-10-CM

## 2023-12-05 MED ORDER — CLOBETASOL PROPIONATE 0.05 % EX OINT: 1.0000 | TOPICAL_OINTMENT | CUTANEOUS | 2 refills | Status: AC

## 2023-12-05 MED ORDER — ESTRADIOL 0.01 % VA CREA: 1.0000 g | TOPICAL_CREAM | VAGINAL | 5 refills | Status: AC

## 2023-12-05 MED ORDER — NORETHINDRONE ACET-ETHINYL EST 1-20 MG-MCG PO TABS: 1.0000 | ORAL_TABLET | Freq: Every day | ORAL | 3 refills | Status: AC

## 2023-12-05 NOTE — Progress Notes (Signed)
 Carol Estrada October 14, 1965 [id number]   History:  58 y.o. G1P1001 presents for annual exam. Monthly cycles. Flow is becoming lighter and shorter. Menses have lightened up, denies menopausal symptoms. Uses Clobetasol  as needed for lichen sclerosus. Using vaginal estrogen for urinary symptoms. Only using a few times per month but does notice a difference with use. Normal pap history. H/O HSV, HLD, hypothyroidism, HTN.   Gynecologic History Patient's last menstrual period was 11/14/2023 (exact date). Period Duration (Days): 3 Period Pattern: Regular Menstrual Flow: Light Menstrual Control: Maxi pad Dysmenorrhea: None Contraception/Family planning: OCP (estrogen/progesterone) Sexually active: Yes  Health Maintenance Last Pap: 12/04/2022. Results were: Normal neg HPV Last mammogram: 02/07/2023. Results were: Normal Last colonoscopy: Never. Negative Cologuard 02/26/2022 Last Dexa: Not indicated     12/05/2023   10:12 AM  Depression screen PHQ 2/9  Decreased Interest 0  Down, Depressed, Hopeless 0  PHQ - 2 Score 0     Past medical history, past surgical history, family history and social history were all reviewed and documented in the EPIC chart. Married. Works at [employer]. 11 yo son, works with [employer]. Mother diagnosed with breast cancer at age 74.   ROS:  A ROS was performed and pertinent positives and negatives are included.  Exam:  Vitals:   12/05/23 1008  BP: 112/70  Pulse: 75  SpO2: 98%  Weight: 165 lb (74.8 kg)  Height: 5' 3.25 (1.607 m)      Body mass index is 29 kg/m.  General appearance:  Normal Thyroid :  Symmetrical, normal in size, without palpable masses or nodularity. Respiratory  Auscultation:  Clear without wheezing or rhonchi Cardiovascular  Auscultation:  Regular rate, without rubs, murmurs or gallops  Edema/varicosities:  Not grossly evident Abdominal  Soft,nontender, without masses, guarding or rebound.  Liver/spleen:  No  organomegaly noted  Hernia:  None appreciated  Skin  Inspection:  Grossly normal Breasts: Examined lying and sitting.   Right: Without masses, retractions, nipple discharge or axillary adenopathy.   Left: Without masses, retractions, nipple discharge or axillary adenopathy. Pelvic: External genitalia:  no lesions              Urethra:  normal appearing urethra with no masses, tenderness or lesions              Bartholins and Skenes: normal                 Vagina: normal appearing vagina with normal color and discharge, no lesions              Cervix: no lesions Bimanual Exam:  Uterus:  no masses or tenderness              Adnexa: no mass, fullness, tenderness              Rectovaginal: Deferred              Anus:  normal, no lesions  Carol Estrada, CMA present as chaperone.   Assessment/Plan:  58 y.o. G1P1001 for annual exam.   Well female exam with routine gynecological exam - Education provided on SBEs, importance of preventative screenings, current guidelines, high calcium diet, regular exercise, and multivitamin daily.  Labs with PCP.   Lichen sclerosus - Plan: clobetasol  cream (TEMOVATE ) 0.05 % twice weekly. Good management.   Encounter for surveillance of contraceptive pills - Plan: norethindrone -ethinyl estradiol  (LOESTRIN) 1-20 MG-MCG tablet daily. Having light monthly cycles.   Atrophic vaginitis - Plan: estradiol  (ESTRACE  VAGINAL) 0.1 MG/GM vaginal  cream twice weekly for urinary symptoms. Good management.  Cervical cancer screening - Normal pap history. Will repeat at 5-year interval per guidelines.   HSV-1 infection - Plan: acyclovir  (ZOVIRAX ) 400 MG tablet BID x 3-5 days at first sign of outbreak. Outbreaks occur on external nose.   Screening for breast cancer - Normal mammogram history. Continue annual screenings. Normal breast exam today.   Screening for colon cancer - Negative Cologuard 01/2022. Will repeat at 3-year interval per recommendation.   Return in about 1  year (around 12/04/2024) for Annual.     Carol DELENA Shutter DNP, 10:35 AM 12/05/2023
